# Patient Record
Sex: Female | Born: 1990 | Race: White | Hispanic: No | Marital: Single | Smoking: Former smoker
Health system: Southern US, Community
[De-identification: ages and names within clinical notes are randomized; demographics above are authoritative.]

## PROBLEM LIST (undated history)

## (undated) DIAGNOSIS — Z789 Other specified health status: Secondary | ICD-10-CM

## (undated) DIAGNOSIS — O26649 Intrahepatic cholestasis of pregnancy, unspecified trimester: Secondary | ICD-10-CM

## (undated) DIAGNOSIS — K831 Obstruction of bile duct: Secondary | ICD-10-CM

## (undated) DIAGNOSIS — O26619 Liver and biliary tract disorders in pregnancy, unspecified trimester: Secondary | ICD-10-CM

## (undated) HISTORY — DX: Obstruction of bile duct: K83.1

## (undated) HISTORY — DX: Intrahepatic cholestasis of pregnancy, unspecified trimester: O26.649

## (undated) HISTORY — DX: Obstruction of bile duct: O26.619

## (undated) HISTORY — PX: NO PAST SURGERIES: SHX2092

---

## 2012-04-18 DIAGNOSIS — O24419 Gestational diabetes mellitus in pregnancy, unspecified control: Secondary | ICD-10-CM

## 2017-11-23 ENCOUNTER — Encounter: Payer: Self-pay | Admitting: Certified Nurse Midwife

## 2017-11-23 ENCOUNTER — Other Ambulatory Visit (HOSPITAL_COMMUNITY)
Admission: RE | Admit: 2017-11-23 | Discharge: 2017-11-23 | Disposition: A | Discharge status: Home / Self Care | Payer: Medicaid Other | Source: Ambulatory Visit | Attending: Certified Nurse Midwife | Admitting: Certified Nurse Midwife

## 2017-11-23 ENCOUNTER — Ambulatory Visit (INDEPENDENT_AMBULATORY_CARE_PROVIDER_SITE_OTHER): Payer: Medicaid Other | Admitting: Certified Nurse Midwife

## 2017-11-23 VITALS — BP 134/79 | HR 115 | Ht 68.0 in | Wt 160.0 lb

## 2017-11-23 DIAGNOSIS — O468X9 Other antepartum hemorrhage, unspecified trimester: Secondary | ICD-10-CM

## 2017-11-23 DIAGNOSIS — O468X1 Other antepartum hemorrhage, first trimester: Secondary | ICD-10-CM

## 2017-11-23 DIAGNOSIS — Z3481 Encounter for supervision of other normal pregnancy, first trimester: Secondary | ICD-10-CM

## 2017-11-23 DIAGNOSIS — Z3A10 10 weeks gestation of pregnancy: Secondary | ICD-10-CM | POA: Insufficient documentation

## 2017-11-23 DIAGNOSIS — O99331 Smoking (tobacco) complicating pregnancy, first trimester: Secondary | ICD-10-CM

## 2017-11-23 DIAGNOSIS — O418X90 Other specified disorders of amniotic fluid and membranes, unspecified trimester, not applicable or unspecified: Secondary | ICD-10-CM | POA: Insufficient documentation

## 2017-11-23 DIAGNOSIS — Z8632 Personal history of gestational diabetes: Secondary | ICD-10-CM

## 2017-11-23 DIAGNOSIS — O99330 Smoking (tobacco) complicating pregnancy, unspecified trimester: Secondary | ICD-10-CM | POA: Insufficient documentation

## 2017-11-23 DIAGNOSIS — Z3480 Encounter for supervision of other normal pregnancy, unspecified trimester: Secondary | ICD-10-CM | POA: Insufficient documentation

## 2017-11-23 DIAGNOSIS — Z141 Cystic fibrosis carrier: Secondary | ICD-10-CM

## 2017-11-23 DIAGNOSIS — Z3687 Encounter for antenatal screening for uncertain dates: Secondary | ICD-10-CM | POA: Diagnosis not present

## 2017-11-23 DIAGNOSIS — O418X10 Other specified disorders of amniotic fluid and membranes, first trimester, not applicable or unspecified: Secondary | ICD-10-CM

## 2017-11-23 NOTE — Patient Instructions (Signed)
First Trimester of Pregnancy  Smoking During Pregnancy Smoking during pregnancy is unhealthy for you and your baby. Smoke from cigarettes, pipes, and cigars contains many chemicals that can cause cancer (carcinogens). Cigarettes also contain a stimulant drug (nicotine). When you smoke, harmful substances that you breathe in enter your bloodstream and can be passed on to your baby. This can affect your baby's development. If you are planning to become pregnant or have recently become pregnant, talk with your health care provider about quitting smoking. How does smoking affect me? Smoking increases your risk for many long-term (chronic) diseases. These diseases include cancer, lung diseases, and heart disease. Smoking during pregnancy increases your risk of:  Losing the pregnancy (miscarriage or stillbirth).  Giving birth too early (premature birth).  Pregnancy outside of the uterus (tubal pregnancy).  Having problems with the organ that provides the baby nourishment and oxygen (placenta), including: ? Attachment of the placenta over the opening of the uterus (placenta previa). ? Detachment of the placenta before the baby's birth (placental abruption).  Having your water break before labor begins (premature rupture of membranes).  How does smoking affect my baby? Before Birth Smoking during pregnancy:  Decreases blood flow and oxygen to your baby.  Increases your baby's risk of birth defects, such as heart defects.  Increases your baby's heart rate.  Slows your baby's growth in the uterus (intrauterine growth retardation).  After Birth Babies born to women who smoked during pregnancy may:  Have symptoms of nicotine withdrawal.  Need to stay in the hospital for special care.  May be too small at birth.  Have a high risk of: ? Serious health problems or lifelong disabilities. ? Sudden infant death syndrome (SIDS). ? Becoming obese. ? Developing behavior or learning  problems.  What can happen if changes are not made? When babies are born with a birth defect or illness, they often need to stay in the hospital longer before going home. Hospital stays may also be longer if you had any complications during labor or delivery. Longer hospital stays and more treatments result in higher costs for health care. Many health issues among babies born to mothers who smoke can have a lifelong impact. This may include the long-term need for certain medicines, therapies, or other treatments. What are the benefits of not smoking during pregnancy? You have a much better chance of having a healthy pregnancy and a healthy baby if you do not smoke while you are pregnant. Not smoking also means that you will have a better chance of living a long and healthy life, and your baby will have a better chance of growing into a healthy child and adult. What actions can be taken? Quitting smoking can be difficult. Ask your health care provider for help to stop smoking. You may also consider:  Counseling to help you quit smoking (smoking cessation counseling).  Psychotherapy.  Acupuncture.  Hypnosis.  Telephone [employer].  If these methods do not help you, talk with your health care provider about other options. Do not take smoking cessation medicines or nicotine supplements unless your health care provider tells you to. Where to find more information: Learn more about smoking during pregnancy and quitting smoking from:  March of Dimes: www.marchofdimes.org/pregnancy/smoking-during-pregnancy.aspx  U.S. Department of Health and Human Services: women.smokefree.gov  American Cancer Society: www.cancer.org  American Heart Association: www.heart.org  National Cancer Institute: www.cancer.gov  For help to quit smoking:  National smoking cessation telephone hotline: 1-800-QUIT NOW 973-349-7981)  Contact a health care provider  if:  You are struggling to quit smoking.  You are  a smoker and you become pregnant or plan to become pregnant.  You start smoking again after giving birth. Summary  Tobacco smoke contains harmful substances that can affect a baby's health and development.  Smoking increases the risk for serious problems, such as miscarriage, birth defects, or premature birth.  If you need help to quit smoking, ask your health care provider. This information is not intended to replace advice given to you by your health care provider. Make sure you discuss any questions you have with your health care provider. Document Released: 09/12/2004 Document Revised: 02/18/2016 Document Reviewed: 02/18/2016 Elsevier Interactive Patient Education  [employer]. The first trimester of pregnancy is from week 1 until the end of week 13 (months 1 through 3). During this time, your baby will begin to develop inside you. At 6-8 weeks, the eyes and face are formed, and the heartbeat can be seen on ultrasound. At the end of 12 weeks, all the baby's organs are formed. Prenatal care is all the medical care you receive before the birth of your baby. Make sure you get good prenatal care and follow all of your doctor's instructions. Follow these instructions at home: Medicines  Take over-the-counter and prescription medicines only as told by your doctor. Some medicines are safe and some medicines are not safe during pregnancy.  Take a prenatal vitamin that contains at least 600 micrograms (mcg) of folic acid.  If you have trouble pooping (constipation), take medicine that will make your stool soft (stool softener) if your doctor approves. Eating and drinking  Eat regular, healthy meals.  Your doctor will tell you the amount of weight gain that is right for you.  Avoid raw meat and uncooked cheese.  If you feel sick to your stomach (nauseous) or throw up (vomit): ? Eat 4 or 5 small meals a day instead of 3 large meals. ? Try eating a few soda crackers. ? Drink liquids  between meals instead of during meals.  To prevent constipation: ? Eat foods that are high in fiber, like fresh fruits and vegetables, whole grains, and beans. ? Drink enough fluids to keep your pee (urine) clear or pale yellow. Activity  Exercise only as told by your doctor. Stop exercising if you have cramps or pain in your lower belly (abdomen) or low back.  Do not exercise if it is too hot, too humid, or if you are in a place of great height (high altitude).  Try to avoid standing for long periods of time. Move your legs often if you must stand in one place for a long time.  Avoid heavy lifting.  Wear low-heeled shoes. Sit and stand up straight.  You can have sex unless your doctor tells you not to. Relieving pain and discomfort  Wear a good support bra if your breasts are sore.  Take warm water baths (sitz baths) to soothe pain or discomfort caused by hemorrhoids. Use hemorrhoid cream if your doctor says it is okay.  Rest with your legs raised if you have leg cramps or low back pain.  If you have puffy, bulging veins (varicose veins) in your legs: ? Wear support hose or compression stockings as told by your doctor. ? Raise (elevate) your feet for 15 minutes, 3-4 times a day. ? Limit salt in your food. Prenatal care  Schedule your prenatal visits by the twelfth week of pregnancy.  Write down your questions. Take them to  your prenatal visits.  Keep all your prenatal visits as told by your doctor. This is important. Safety  Wear your seat belt at all times when driving.  Make a list of emergency phone numbers. The list should include numbers for family, friends, the hospital, and police and fire departments. General instructions  Ask your doctor for a referral to a local prenatal class. Begin classes no later than at the start of month 6 of your pregnancy.  Ask for help if you need counseling or if you need help with nutrition. Your doctor can give you advice or tell  you where to go for help.  Do not use hot tubs, steam rooms, or saunas.  Do not douche or use tampons or scented sanitary pads.  Do not cross your legs for long periods of time.  Avoid all herbs and alcohol. Avoid drugs that are not approved by your doctor.  Do not use any tobacco products, including cigarettes, chewing tobacco, and electronic cigarettes. If you need help quitting, ask your doctor. You may get counseling or other support to help you quit.  Avoid cat litter boxes and soil used by cats. These carry germs that can cause birth defects in the baby and can cause a loss of your baby (miscarriage) or stillbirth.  Visit your dentist. At home, brush your teeth with a soft toothbrush. Be gentle when you floss. Contact a doctor if:  You are dizzy.  You have mild cramps or pressure in your lower belly.  You have a nagging pain in your belly area.  You continue to feel sick to your stomach, you throw up, or you have watery poop (diarrhea).  You have a bad smelling fluid coming from your vagina.  You have pain when you pee (urinate).  You have increased puffiness (swelling) in your face, hands, legs, or ankles. Get help right away if:  You have a fever.  You are leaking fluid from your vagina.  You have spotting or bleeding from your vagina.  You have very bad belly cramping or pain.  You gain or lose weight rapidly.  You throw up blood. It may look like coffee grounds.  You are around people who have [place] measles, fifth disease, or chickenpox.  You have a very bad headache.  You have shortness of breath.  You have any kind of trauma, such as from a fall or a car accident. Summary  The first trimester of pregnancy is from week 1 until the end of week 13 (months 1 through 3).  To take care of yourself and your unborn baby, you will need to eat healthy meals, take medicines only if your doctor tells you to do so, and do activities that are safe for you and  your baby.  Keep all follow-up visits as told by your doctor. This is important as your doctor will have to ensure that your baby is healthy and growing well. This information is not intended to replace advice given to you by your health care provider. Make sure you discuss any questions you have with your health care provider. Document Released: 10/18/2007 Document Revised: 05/09/2016 Document Reviewed: 05/09/2016 Elsevier Interactive Patient Education  2017 [employer]

## 2017-11-23 NOTE — Progress Notes (Signed)
Bedside u/S shows single IUP with FHT of 166 BPM and CRL is 33.364mm  [facility] 5519[facility]2d[facility]

## 2017-11-23 NOTE — Progress Notes (Signed)
Subjective:  Candace Ford is a 27 y.o. G4P2010 at 6943w2d being seen today for initial prenatal care.  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  Patient reports no complaints.   . Vag. Bleeding: None.   . Denies leaking of fluid.   The following portions of the patient's history were reviewed and updated as appropriate: allergies, current medications, past family history, past medical history, past social history, past surgical history and problem list. Problem list updated.  Objective:   Vitals:   11/23/17 1021 11/23/17 1022  BP: 134/79   Pulse: (!) 115   Weight: 160 lb (72.6 kg)   Height:  5\' 8"  (1.727 m)    Fetal Status: Fetal Heart Rate (bpm): 166         General:  Alert, oriented and cooperative. Patient is in no acute distress.  Skin: Skin is warm and dry. No rash noted.   Cardiovascular: Normal heart rate noted  Respiratory: Normal respiratory effort, no problems with respiration noted  Abdomen: Soft, gravid, appropriate for gestational age.       Pelvic: Vag. Bleeding: None Vag D/C Character: Thin   Cervical exam deferred        Extremities: Normal range of motion.     Mental Status: Normal mood and affect. Normal behavior. Normal judgment and thought content.  Breast: normal  Urinalysis: Urine Protein: Negative Urine Glucose: Negative  Assessment and Plan:  Pregnancy: G4P2010 at 5943w2d  1. Encounter for supervision of other normal pregnancy in first trimester - Obstetric panel - HIV antibody (with reflex) - Culture, OB Urine - US bedside; Future - US bedside; Future - HgB A1c - Cytology - PAP - NIPS  2. Maternal tobacco use in first trimester - 1/4 ppd -discussed cessation  3. Subchorionic hematoma in first trimester, single or  unspecified fetus - bleeding precautions  4. History of gestational diabetes - A1c today and early GDM screen  5. Cystic fibrosis gene carrier - genetic counseling  Preterm labor symptoms and general obstetric precautions including but not limited to vaginal bleeding, contractions, leaking of fluid and fetal movement were reviewed in detail with the patient. Please refer to After Visit Summary for other counseling recommendations.  Return in about 1 month (around 12/21/2017).   Donette LarryBhambri, Ryanne Morand, CNM

## 2017-11-25 LAB — URINE CULTURE, OB REFLEX

## 2017-11-25 LAB — CULTURE, OB URINE

## 2017-11-26 LAB — OBSTETRIC PANEL
ANTIBODY SCREEN: NOT DETECTED
Basophils Absolute: 18 cells/uL (ref 0–200)
Basophils Relative: 0.3 %
EOS PCT: 1.8 %
Eosinophils Absolute: 110 cells/uL (ref 15–500)
HCT: 37 % (ref 35.0–45.0)
HEMOGLOBIN: 12.4 g/dL (ref 11.7–15.5)
Hepatitis B Surface Ag: NONREACTIVE
Lymphs Abs: 1220 cells/uL (ref 850–3900)
MCH: 29.3 pg (ref 27.0–33.0)
MCHC: 33.5 g/dL (ref 32.0–36.0)
MCV: 87.5 fL (ref 80.0–100.0)
MPV: 9.9 fL (ref 7.5–12.5)
Monocytes Relative: 8 %
NEUTROS ABS: 4264 {cells}/uL (ref 1500–7800)
Neutrophils Relative %: 69.9 %
Platelets: 228 10*3/uL (ref 140–400)
RBC: 4.23 10*6/uL (ref 3.80–5.10)
RDW: 12.1 % (ref 11.0–15.0)
RPR Ser Ql: NONREACTIVE
Rubella: 1.81 index
Total Lymphocyte: 20 %
WBC: 6.1 10*3/uL (ref 3.8–10.8)
WBCMIX: 488 {cells}/uL (ref 200–950)

## 2017-11-26 LAB — HEMOGLOBIN A1C
EAG (MMOL/L): 5.4 (calc)
HEMOGLOBIN A1C: 5 %{Hb} (ref <5.7)
MEAN PLASMA GLUCOSE: 97 (calc)

## 2017-11-26 LAB — HIV ANTIBODY (ROUTINE TESTING W REFLEX): HIV 1&2 Ab, 4th Generation: NONREACTIVE

## 2017-11-27 LAB — CYTOLOGY - PAP
Chlamydia: NEGATIVE
Diagnosis: NEGATIVE
Neisseria Gonorrhea: NEGATIVE

## 2017-11-28 ENCOUNTER — Telehealth: Payer: Self-pay

## 2017-11-28 ENCOUNTER — Encounter (HOSPITAL_COMMUNITY): Payer: Self-pay

## 2017-11-28 NOTE — Telephone Encounter (Signed)
Spoke with pt and she is aware of normal pap smear results. Pap showed organisms consistent with a yeast infection. When I spoke with pt to see if she is symptomatic she states that she is not and treatment would not be necessary.

## 2017-11-30 ENCOUNTER — Ambulatory Visit (HOSPITAL_COMMUNITY)
Admission: RE | Admit: 2017-11-30 | Discharge: 2017-11-30 | Disposition: A | Discharge status: Home / Self Care | Payer: Medicaid Other | Source: Ambulatory Visit | Attending: Certified Nurse Midwife | Admitting: Certified Nurse Midwife

## 2017-12-24 ENCOUNTER — Ambulatory Visit (INDEPENDENT_AMBULATORY_CARE_PROVIDER_SITE_OTHER): Payer: Medicaid Other | Admitting: Certified Nurse Midwife

## 2017-12-24 VITALS — BP 116/72 | HR 95 | Wt 160.0 lb

## 2017-12-24 DIAGNOSIS — Z3482 Encounter for supervision of other normal pregnancy, second trimester: Secondary | ICD-10-CM

## 2017-12-24 DIAGNOSIS — Z8632 Personal history of gestational diabetes: Secondary | ICD-10-CM

## 2017-12-24 DIAGNOSIS — Z141 Cystic fibrosis carrier: Secondary | ICD-10-CM

## 2017-12-24 DIAGNOSIS — O99332 Smoking (tobacco) complicating pregnancy, second trimester: Secondary | ICD-10-CM

## 2017-12-24 DIAGNOSIS — Z3480 Encounter for supervision of other normal pregnancy, unspecified trimester: Secondary | ICD-10-CM

## 2017-12-24 NOTE — Progress Notes (Signed)
Subjective:  Candace Ford is a 27 y.o. [phone] at 10121w5d being seen today for ongoing prenatal care.  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  Patient reports no complaints.  Contractions: Not present. Vag. Bleeding: None.  Movement: Absent. Denies leaking of fluid.   The following portions of the patient's history were reviewed and updated as appropriate: allergies, current medications, past family history, past medical history, past social history, past surgical history and problem list. Problem list updated.  Objective:   Vitals:   12/24/17 1034  BP: 116/72  Pulse: 95  Weight: 72.6 kg    Fetal Status: Fetal Heart Rate (bpm): 148 Fundal Height: 14 cm Movement: Absent     General:  Alert, oriented and cooperative. Patient is in no acute distress.  Skin: Skin is warm and dry. No rash noted.   Cardiovascular: Normal heart rate noted  Respiratory: Normal respiratory effort, no problems with respiration noted  Abdomen: Soft, gravid, appropriate for gestational age. Pain/Pressure: Absent     Pelvic: Vag. Bleeding: None Vag D/C Character: Thin   Cervical exam deferred        Extremities: Normal range of motion.  Edema: None  Mental Status: Normal mood and affect. Normal behavior. Normal judgment and thought content.   Urinalysis:      Assessment and Plan:  Pregnancy: [id number] at 7021w5d  1. Encounter for supervision of other normal pregnancy in second trimester - US MFM OB COMP + 14 WK; Future  2. Maternal tobacco use in second trimester -vaping- 1 pod per week -discussed cessation, fetal affects are unknown but nicotine may cause adverse fetal outcomes  3. History of gestational diabetes - early GTT  4. Cystic fibrosis carrier - schedule genetic  counseling  Preterm labor symptoms and general obstetric precautions including but not limited to vaginal bleeding, contractions, leaking of fluid and fetal movement were reviewed in detail with the patient. Please refer to After Visit Summary for other counseling recommendations.  Return in about 4 weeks (around 01/21/2018).   Donette LarryBhambri, Aryianna Earwood, CNM

## 2017-12-24 NOTE — Patient Instructions (Signed)
Second Trimester of Pregnancy The second trimester is from week 13 through week 28, month 4 through 6. This is often the time in pregnancy that you feel your best. Often times, morning sickness has lessened or quit. You may have more energy, and you may get hungry more often. Your unborn baby (fetus) is growing rapidly. At the end of the sixth month, he or she is about 9 inches long and weighs about 1 pounds. You will likely feel the baby move (quickening) between 18 and 20 weeks of pregnancy. Follow these instructions at home:  Avoid all smoking, herbs, and alcohol. Avoid drugs not approved by your doctor.  Do not use any tobacco products, including cigarettes, chewing tobacco, and electronic cigarettes. If you need help quitting, ask your doctor. You may get counseling or other support to help you quit.  Only take medicine as told by your doctor. Some medicines are safe and some are not during pregnancy.  Exercise only as told by your doctor. Stop exercising if you start having cramps.  Eat regular, healthy meals.  Wear a good support bra if your breasts are tender.  Do not use hot tubs, steam rooms, or saunas.  Wear your seat belt when driving.  Avoid raw meat, uncooked cheese, and liter boxes and soil used by cats.  Take your prenatal vitamins.  Take 1500-2000 milligrams of calcium daily starting at the 20th week of pregnancy until you deliver your baby.  Try taking medicine that helps you poop (stool softener) as needed, and if your doctor approves. Eat more fiber by eating fresh fruit, vegetables, and whole grains. Drink enough fluids to keep your pee (urine) clear or pale yellow.  Take warm water baths (sitz baths) to soothe pain or discomfort caused by hemorrhoids. Use hemorrhoid cream if your doctor approves.  If you have puffy, bulging veins (varicose veins), wear support hose. Raise (elevate) your feet for 15 minutes, 3-4 times a day. Limit salt in your diet.  Avoid heavy  lifting, wear low heals, and sit up straight.  Rest with your legs raised if you have leg cramps or low back pain.  Visit your dentist if you have not gone during your pregnancy. Use a soft toothbrush to brush your teeth. Be gentle when you floss.  You can have sex (intercourse) unless your doctor tells you not to.  Go to your doctor visits. Get help if:  You feel dizzy.  You have mild cramps or pressure in your lower belly (abdomen).  You have a nagging pain in your belly area.  You continue to feel sick to your stomach (nauseous), throw up (vomit), or have watery poop (diarrhea).  You have bad smelling fluid coming from your vagina.  You have pain with peeing (urination). Get help right away if:  You have a fever.  You are leaking fluid from your vagina.  You have spotting or bleeding from your vagina.  You have severe belly cramping or pain.  You lose or gain weight rapidly.  You have trouble catching your breath and have chest pain.  You notice sudden or extreme puffiness (swelling) of your face, hands, ankles, feet, or legs.  You have not felt the baby move in over an hour.  You have severe headaches that do not go away with medicine.  You have vision changes. This information is not intended to replace advice given to you by your health care provider. Make sure you discuss any questions you have with your health care   provider. Document Released: 07/26/2009 Document Revised: 10/07/2015 Document Reviewed: 07/02/2012 Elsevier Interactive Patient Education  2017 [employer] Smoking During Pregnancy Smoking during pregnancy is unhealthy for you and your baby. Smoke from cigarettes, pipes, and cigars contains many chemicals that can cause cancer (carcinogens). Cigarettes also contain a stimulant drug (nicotine). When you smoke, harmful substances that you breathe in enter your bloodstream and can be passed on to your baby. This can affect your baby's development. If  you are planning to become pregnant or have recently become pregnant, talk with your health care provider about quitting smoking. How does smoking affect me? Smoking increases your risk for many long-term (chronic) diseases. These diseases include cancer, lung diseases, and heart disease. Smoking during pregnancy increases your risk of:  Losing the pregnancy (miscarriage or stillbirth).  Giving birth too early (premature birth).  Pregnancy outside of the uterus (tubal pregnancy).  Having problems with the organ that provides the baby nourishment and oxygen (placenta), including: ? Attachment of the placenta over the opening of the uterus (placenta previa). ? Detachment of the placenta before the baby's birth (placental abruption).  Having your water break before labor begins (premature rupture of membranes).  How does smoking affect my baby? Before Birth Smoking during pregnancy:  Decreases blood flow and oxygen to your baby.  Increases your baby's risk of birth defects, such as heart defects.  Increases your baby's heart rate.  Slows your baby's growth in the uterus (intrauterine growth retardation).  After Birth Babies born to women who smoked during pregnancy may:  Have symptoms of nicotine withdrawal.  Need to stay in the hospital for special care.  May be too small at birth.  Have a high risk of: ? Serious health problems or lifelong disabilities. ? Sudden infant death syndrome (SIDS). ? Becoming obese. ? Developing behavior or learning problems.  What can happen if changes are not made? When babies are born with a birth defect or illness, they often need to stay in the hospital longer before going home. Hospital stays may also be longer if you had any complications during labor or delivery. Longer hospital stays and more treatments result in higher costs for health care. Many health issues among babies born to mothers who smoke can have a lifelong impact. This may  include the long-term need for certain medicines, therapies, or other treatments. What are the benefits of not smoking during pregnancy? You have a much better chance of having a healthy pregnancy and a healthy baby if you do not smoke while you are pregnant. Not smoking also means that you will have a better chance of living a long and healthy life, and your baby will have a better chance of growing into a healthy child and adult. What actions can be taken? Quitting smoking can be difficult. Ask your health care provider for help to stop smoking. You may also consider:  Counseling to help you quit smoking (smoking cessation counseling).  Psychotherapy.  Acupuncture.  Hypnosis.  Telephone [employer] Products[employer]IT hotlines.  If these methods do not help you, talk with your health care provider about other options. Do not take smoking cessation medicines or nicotine supplements unless your health care provider tells you to. Where to find more information: Learn more about smoking during pregnancy and quitting smoking from:  March of Dimes: www.marchofdimes.org/pregnancy/smoking-during-pregnancy.aspx  U.S. Department of Health and Human Services: women.smokefree.gov  American Cancer Society: www.cancer.org  American Heart Association: www.heart.org  National Cancer Institute: www.cancer.gov  For help to quit smoking:  National smoking cessation telephone hotline: 1-800-QUIT NOW (518)196-2303(916-480-9990)  Contact a health care provider if:  You are struggling to quit smoking.  You are a smoker and you become pregnant or plan to become pregnant.  You start smoking again after giving birth. Summary  Tobacco smoke contains harmful substances that can affect a baby's health and development.  Smoking increases the risk for serious problems, such as miscarriage, birth defects, or premature birth.  If you need help to quit smoking, ask your health care provider. This information is not intended to replace advice  given to you by your health care provider. Make sure you discuss any questions you have with your health care provider. Document Released: 09/12/2004 Document Revised: 02/18/2016 Document Reviewed: 02/18/2016 Elsevier Interactive Patient Education  2018 [employer] What You Need to Know About Electronic Cigarettes Electronic cigarettes, or e-cigarettes, are battery-operated devices that deliver nicotine to your body. They come in many shapes, including in the shape of a cigarette, pipe, pen, and even a USB memory stick. E-cigarettes have a cartridge that contains a liquid form of nicotine. When you use the device, the liquid heats up. It then becomes a vapor. Inhaling this vapor is called vaping. While e-cigarettes do not contain tar and the same cancer-causing chemicals that are in tobacco cigarettes, they may contain other harmful and cancer-causing chemicals, such as formaldehyde or acetaldehyde. Nicotine is thought to increase your risk for certain types of cancer. Many e-cigarettes have chemical colorings and flavorings. It is not clear how much nicotine you get when vaping. The health effects of vaping are not completely known. Some people may use e-cigarettes in order to quit smoking tobacco. However, this has not been proven to work, and the Education officer, environmentalood and Drug Administration (FDA) has not approved e-cigarettes for this purpose. How can using electronic cigarettes affect me?  E-cigarettes contain nicotine, which is a very addictive drug. Vaping may make you crave nicotine. Nicotine: ? Changes your blood sugar levels. ? Increases your heart rate, blood pressure, and breathing rate. ? Increases your risk of developing blood clots (hypercoaguable state) and diabetes.  If you smoke e-cigarettes, you may be more likely to start smoking or to smoke more tobacco cigarettes.  Becoming addicted to nicotine may make your brain more sensitive to other addictive drugs. You may move to other addictive  substances.  If you are pregnant, the nicotine in e-cigarettes may be harmful to your baby. Nicotine can cause: ? Brain or lung problems for your baby. ? Your baby to be born too early. ? Your baby to be born with a low birth weight.  If you are a child or a teen, vaping may affect your memory or lower your attention span.  You may be in danger of overdosing on nicotine. Nicotine poisoning can cause nausea, vomiting, seizures, and trouble breathing. What are the benefits of stopping vaping? If you stop vaping, you can avoid:  Getting addicted to nicotine.  Having nicotine side effects.  Getting nicotine poisoning.  Being exposed to dangerous chemicals.  Increasing your risk of health problems.  Increasing your baby's risk of health problems, if you are pregnant.  Being more likely to use other addictive substances.  What steps can I take to stop vaping? If you can stop vaping on your own, do it before you become addicted to nicotine. If you need help stopping, ask your health care provider. There are three effective ways to fight nicotine addiction:  Nicotine replacement therapy. Using nicotine gum or  a nicotine patch blocks your craving for nicotine. Over time, you can reduce the amount of nicotine you use until you can stop using nicotine completely without having cravings.  Prescription medicines approved to fight nicotine addiction. These stop nicotine cravings or block the effects of nicotine.  Behavioral therapy. This may include: ? A self-help smoking cessation program. ? Individual or group therapy. ? A smoking cessation support group.  Where can I get support? You can get support at these sites:  U.S. [employer] of Medicine: WealthAccelerators.nl  U.S. Department of Health and Human Services: https://smokefree.gov  American Lung Association: ModelSolar.es  Where can I  get more information? Learn more about e-cigarettes from:  U.S. [employer] of Health: http://www.pena.net/  U.S. Department of Health and Human Services: PrankTips.hu  Summary  E-cigarettes can cause nicotine addiction.  E-cigarettes are not approved as a way to stop smoking.  E-cigarettes are not a risk-free alternative to smoking tobacco.  There are ways to fight nicotine addiction.  Talk to your health care provider if you are unable to stop vaping on your own. This information is not intended to replace advice given to you by your health care provider. Make sure you discuss any questions you have with your health care provider. Document Released: 08/23/2015 Document Revised: 01/19/2016 Document Reviewed: 04/23/2015 Elsevier Interactive Patient Education  [employer].

## 2018-01-02 ENCOUNTER — Telehealth: Payer: Self-pay | Admitting: *Deleted

## 2018-01-02 MED ORDER — TERCONAZOLE 0.8 % VA CREA: 1.0000 | TOPICAL_CREAM | Freq: Every day | VAGINAL | 0 refills | Status: DC

## 2018-01-02 NOTE — Telephone Encounter (Signed)
Pt called stating that she had yeast come back on her pap a couple of weeks ago and now she has the itching and d/c.  She is requesting something for the yeast.  [facility] is pregnant [facility].

## 2018-01-09 ENCOUNTER — Other Ambulatory Visit: Payer: Medicaid Other

## 2018-01-09 ENCOUNTER — Other Ambulatory Visit: Payer: Self-pay

## 2018-01-09 DIAGNOSIS — Z3480 Encounter for supervision of other normal pregnancy, unspecified trimester: Secondary | ICD-10-CM

## 2018-01-09 NOTE — Progress Notes (Signed)
MFM called needing order to be changed from MFM Complete to MFM Detail. Order changed.

## 2018-01-15 ENCOUNTER — Telehealth: Payer: Self-pay | Admitting: *Deleted

## 2018-01-15 ENCOUNTER — Other Ambulatory Visit: Payer: Medicaid Other

## 2018-01-15 NOTE — Telephone Encounter (Signed)
Left patient a voicemail to call and reschedule her GTT and Quad appointment missed on 01/15/18 at 8:45am. Because the importance of both test it is vital that these are done as soon as possible.

## 2018-01-16 ENCOUNTER — Encounter (HOSPITAL_COMMUNITY): Payer: Self-pay

## 2018-01-23 ENCOUNTER — Ambulatory Visit (HOSPITAL_COMMUNITY): Admission: RE | Admit: 2018-01-23 | Payer: Medicaid Other | Source: Ambulatory Visit

## 2018-01-23 ENCOUNTER — Encounter (HOSPITAL_COMMUNITY): Payer: Self-pay

## 2018-01-23 ENCOUNTER — Ambulatory Visit (HOSPITAL_COMMUNITY)
Admission: RE | Admit: 2018-01-23 | Discharge: 2018-01-23 | Disposition: A | Discharge status: Home / Self Care | Payer: Medicaid Other | Source: Ambulatory Visit | Attending: Certified Nurse Midwife | Admitting: Certified Nurse Midwife

## 2018-01-23 ENCOUNTER — Ambulatory Visit (HOSPITAL_COMMUNITY): Payer: Medicaid Other

## 2018-01-23 ENCOUNTER — Other Ambulatory Visit (HOSPITAL_COMMUNITY): Payer: Medicaid Other

## 2018-01-23 DIAGNOSIS — Z3480 Encounter for supervision of other normal pregnancy, unspecified trimester: Secondary | ICD-10-CM

## 2018-01-23 DIAGNOSIS — Z363 Encounter for antenatal screening for malformations: Secondary | ICD-10-CM | POA: Diagnosis not present

## 2018-01-23 DIAGNOSIS — O09293 Supervision of pregnancy with other poor reproductive or obstetric history, third trimester: Secondary | ICD-10-CM | POA: Diagnosis not present

## 2018-01-23 DIAGNOSIS — Z3482 Encounter for supervision of other normal pregnancy, second trimester: Secondary | ICD-10-CM | POA: Diagnosis not present

## 2018-01-23 DIAGNOSIS — Z3A19 19 weeks gestation of pregnancy: Secondary | ICD-10-CM | POA: Insufficient documentation

## 2018-01-23 DIAGNOSIS — O99332 Smoking (tobacco) complicating pregnancy, second trimester: Secondary | ICD-10-CM

## 2018-01-23 HISTORY — DX: Other specified health status: Z78.9

## 2018-01-24 ENCOUNTER — Encounter (HOSPITAL_COMMUNITY): Payer: Self-pay

## 2018-01-24 ENCOUNTER — Ambulatory Visit (INDEPENDENT_AMBULATORY_CARE_PROVIDER_SITE_OTHER): Payer: Medicaid Other | Admitting: Obstetrics & Gynecology

## 2018-01-24 VITALS — BP 112/74 | HR 99 | Wt 159.0 lb

## 2018-01-24 DIAGNOSIS — Z141 Cystic fibrosis carrier: Secondary | ICD-10-CM

## 2018-01-24 DIAGNOSIS — Z3480 Encounter for supervision of other normal pregnancy, unspecified trimester: Secondary | ICD-10-CM

## 2018-01-24 DIAGNOSIS — Z3482 Encounter for supervision of other normal pregnancy, second trimester: Secondary | ICD-10-CM

## 2018-01-24 NOTE — Progress Notes (Signed)
   PRENATAL VISIT NOTE  Subjective:  Candace Ford is a 27 y.o. [phone] at 4752w1d being seen today for ongoing prenatal care.  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  Patient reports no complaints.  Contractions: Not present. Vag. Bleeding: None.  Movement: Present. Denies leaking of fluid.   The following portions of the patient's history were reviewed and updated as appropriate: allergies, current medications, past family history, past medical history, past social history, past surgical history and problem list. Problem list updated.  Objective:   Vitals:   01/24/18 1317  BP: 112/74  Pulse: 99  Weight: 159 lb (72.1 kg)    Fetal Status: Fetal Heart Rate (bpm): 154   Movement: Present     General:  Alert, oriented and cooperative. Patient is in no acute distress.  Skin: Skin is warm and dry. No rash noted.   Cardiovascular: Normal heart rate noted  Respiratory: Normal respiratory effort, no problems with respiration noted  Abdomen: Soft, gravid, appropriate for gestational age.  Pain/Pressure: Absent     Pelvic: Cervical exam deferred        Extremities: Normal range of motion.  Edema: None  Mental Status: Normal mood and affect. Normal behavior. Normal judgment and thought content.   Assessment and Plan:  Pregnancy: [id number] at 3452w1d  1. Supervision of other normal pregnancy, antepartum -Quad screen today  Preterm labor symptoms and general obstetric precautions including but not limited to vaginal bleeding, contractions, leaking of fluid and fetal movement were reviewed in detail with the patient. Please refer to After Visit Summary for other counseling recommendations.  No follow-ups on file.  Future Appointments  Date Time Provider  Department Center  01/28/2018 11:00 AM [facility] GENETIC COUNSELING [facility]    Candace BossierMyra C Joseline Mccampbell, MD

## 2018-01-24 NOTE — Progress Notes (Signed)
   PRENATAL VISIT NOTE  Subjective:  Candace Ford is a 27 y.o. [phone] at 253w1d being seen today for ongoing prenatal care.  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  Patient reports no complaints.  Contractions: Not present. Vag. Bleeding: None.  Movement: Present. Denies leaking of fluid.   The following portions of the patient's history were reviewed and updated as appropriate: allergies, current medications, past family history, past medical history, past social history, past surgical history and problem list. Problem list updated.  Objective:   Vitals:   01/24/18 1317  BP: 112/74  Pulse: 99  Weight: 159 lb (72.1 kg)    Fetal Status: Fetal Heart Rate (bpm): 154   Movement: Present     General:  Alert, oriented and cooperative. Patient is in no acute distress.  Skin: Skin is warm and dry. No rash noted.   Cardiovascular: Normal heart rate noted  Respiratory: Normal respiratory effort, no problems with respiration noted  Abdomen: Soft, gravid, appropriate for gestational age.  Pain/Pressure: Absent     Pelvic: Cervical exam deferred        Extremities: Normal range of motion.  Edema: None  Mental Status: Normal mood and affect. Normal behavior. Normal judgment and thought content.   Assessment and Plan:  Pregnancy: [id number] at 553w1d  1. Supervision of other normal pregnancy, antepartum  - Alpha fetoprotein, maternal  Preterm labor symptoms and general obstetric precautions including but not limited to vaginal bleeding, contractions, leaking of fluid and fetal movement were reviewed in detail with the patient. Please refer to After Visit Summary for other counseling recommendations.  No follow-ups on file.  Future Appointments  Date Time  Provider Department Center  01/28/2018 11:00 AM [facility] GENETIC COUNSELING [facility]    Allie BossierMyra C Cortne Amara, MD

## 2018-01-24 NOTE — Addendum Note (Signed)
Addended by: Kathie DikeSOLA, DEANNA J on: 01/24/2018 01:35 PM   Modules accepted: Orders

## 2018-01-24 NOTE — Progress Notes (Signed)
Pt is coming Monday for AFP and early GTT

## 2018-01-25 LAB — AFP, QUAD SCREEN
AFP: 75.6 ng/mL
Age Alone: 1
Curr Gest Age: 19.1 weeks
Down Syndrome Scr Risk Est: <1
HCG TOTAL: 35.97 [IU]/mL
INH: 231 pg/mL
MOM FOR HCG: 1.76
MOM FOR INH: 1.35
Maternal Wt: 159 [lb_av]
MoM for AFP: 1.58
Osb Risk: 1
TWINS-AFP: 1
UE3 MOM: 1.34
UE3 VALUE: 2.03 ng/mL

## 2018-01-28 ENCOUNTER — Ambulatory Visit (HOSPITAL_COMMUNITY): Admission: RE | Admit: 2018-01-28 | Payer: Medicaid Other | Source: Ambulatory Visit

## 2018-02-21 ENCOUNTER — Encounter: Payer: Medicaid Other | Admitting: Obstetrics & Gynecology

## 2018-02-22 ENCOUNTER — Ambulatory Visit (INDEPENDENT_AMBULATORY_CARE_PROVIDER_SITE_OTHER): Payer: Medicaid Other | Admitting: Certified Nurse Midwife

## 2018-02-22 VITALS — BP 99/67 | HR 79 | Wt 164.0 lb

## 2018-02-22 DIAGNOSIS — Z141 Cystic fibrosis carrier: Secondary | ICD-10-CM

## 2018-02-22 DIAGNOSIS — O99332 Smoking (tobacco) complicating pregnancy, second trimester: Secondary | ICD-10-CM

## 2018-02-22 DIAGNOSIS — Z3482 Encounter for supervision of other normal pregnancy, second trimester: Secondary | ICD-10-CM

## 2018-02-22 DIAGNOSIS — Z3480 Encounter for supervision of other normal pregnancy, unspecified trimester: Secondary | ICD-10-CM

## 2018-02-22 NOTE — Progress Notes (Signed)
Subjective:  Candace Ford is a 27 y.o. [phone] at [redacted]w[redacted]d being seen today for ongoing prenatal care.  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  Contractions: Not present. Vag. Bleeding: None.  Movement: Present. Denies leaking of fluid.   The following portions of the patient's history were reviewed and updated as appropriate: allergies, current medications, past family history, past medical history, past social history, past surgical history and problem list. Problem list updated.  Objective:   Vitals:   02/22/18 0856  BP: 99/67  Pulse: 79  Weight: 74.4 kg    Fetal Status: Fetal Heart Rate (bpm): 143 Fundal Height: 23 cm Movement: Present     General:  Alert, oriented and cooperative. Patient is in no acute distress.  Skin: Skin is warm and dry. No rash noted.   Cardiovascular: Normal heart rate noted  Respiratory: Normal respiratory effort, no problems with respiration noted  Abdomen: Soft, gravid, appropriate for gestational age. Pain/Pressure: Absent     Pelvic: Vag. Bleeding: None Vag D/C Character: Thin   Cervical exam deferred        Extremities: Normal range of motion.  Edema: Trace  Mental Status: Normal mood and affect. Normal behavior. Normal judgment and thought content.   Urinalysis:      Assessment and Plan:  Pregnancy: [id number] at [redacted]w[redacted]d  1. Supervision of other normal pregnancy, antepartum - discussed maternity support belt  2. Maternal tobacco use in second trimester - uses vape, cut down to 1 pod q3 weeks - encouraged decrease and cessation  Preterm labor symptoms and general obstetric precautions including but not limited to vaginal  bleeding, contractions, leaking of fluid and fetal movement were reviewed in detail with the patient. Please refer to After Visit Summary for other counseling recommendations.  Return in about 5 weeks (around 03/29/2018).   Donette Larry, CNM

## 2018-02-22 NOTE — Patient Instructions (Signed)

## 2018-03-29 ENCOUNTER — Telehealth: Payer: Self-pay | Admitting: *Deleted

## 2018-03-29 NOTE — Telephone Encounter (Signed)
Pt. Needs to reschedule missed GTT/ROB appt on 03/29/18 at 8:45am. Message was left on a number that pt called from earlier in the wk to confirm appt. [phone]. I am not sure if the number belongs to her or not.

## 2018-04-08 ENCOUNTER — Ambulatory Visit (INDEPENDENT_AMBULATORY_CARE_PROVIDER_SITE_OTHER): Payer: Medicaid Other | Admitting: Family Medicine

## 2018-04-08 VITALS — BP 111/73 | HR 89 | Wt 170.0 lb

## 2018-04-08 DIAGNOSIS — Z23 Encounter for immunization: Secondary | ICD-10-CM

## 2018-04-08 DIAGNOSIS — Z3483 Encounter for supervision of other normal pregnancy, third trimester: Secondary | ICD-10-CM

## 2018-04-08 DIAGNOSIS — Z141 Cystic fibrosis carrier: Secondary | ICD-10-CM

## 2018-04-08 DIAGNOSIS — Z8632 Personal history of gestational diabetes: Secondary | ICD-10-CM

## 2018-04-08 DIAGNOSIS — Z3480 Encounter for supervision of other normal pregnancy, unspecified trimester: Secondary | ICD-10-CM

## 2018-04-08 NOTE — Progress Notes (Signed)
   PRENATAL VISIT NOTE  Subjective:  Candace Ford is a 27 y.o. [phone] at 853w5d being seen today for ongoing prenatal care.  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  Patient reports no complaints.  Contractions: Not present. Vag. Bleeding: None.  Movement: Present. Denies leaking of fluid.   The following portions of the patient's history were reviewed and updated as appropriate: allergies, current medications, past family history, past medical history, past social history, past surgical history and problem list. Problem list updated.  Objective:   Vitals:   04/08/18 1109  BP: 111/73  Pulse: 89  Weight: 170 lb (77.1 kg)    Fetal Status: Fetal Heart Rate (bpm): 153   Movement: Present     General:  Alert, oriented and cooperative. Patient is in no acute distress.  Skin: Skin is warm and dry. No rash noted.   Cardiovascular: Normal heart rate noted  Respiratory: Normal respiratory effort, no problems with respiration noted  Abdomen: Soft, gravid, appropriate for gestational age.  Pain/Pressure: Absent     Pelvic: Cervical exam deferred        Extremities: Normal range of motion.  Edema: Trace  Mental Status: Normal mood and affect. Normal behavior. Normal judgment and thought content.   Assessment and Plan:  Pregnancy: [id number] at 693w5d  1. Supervision of other normal pregnancy, antepartum FHT and FH normal - HIV antibody (with reflex) - CBC - RPR - 2Hr GTT w/ 1 Hr Carpenter 75 g - Tdap vaccine greater than or equal to 7yo IM  2. Cystic fibrosis gene carrier Discussed testing FOB. Discussed Genetics referral - needs to make appt.  3. History of gestational diabetes   Preterm labor symptoms and general obstetric precautions including but not  limited to vaginal bleeding, contractions, leaking of fluid and fetal movement were reviewed in detail with the patient. Please refer to After Visit Summary for other counseling recommendations.  Return in about 2 weeks (around 04/22/2018) for OB f/u.  No future appointments.  Levie HeritageJacob J , DO

## 2018-04-08 NOTE — Progress Notes (Signed)
Bedside U/S shows single IUP with CRL measuring 8.751mm and GA is 960w5d  FHT 129 BPM

## 2018-04-09 LAB — HIV ANTIBODY (ROUTINE TESTING W REFLEX): HIV 1&2 Ab, 4th Generation: NONREACTIVE

## 2018-04-09 LAB — CBC
HCT: 29.6 % — ABNORMAL LOW (ref 35.0–45.0)
Hemoglobin: 9.9 g/dL — ABNORMAL LOW (ref 11.7–15.5)
MCH: 28.2 pg (ref 27.0–33.0)
MCHC: 33.4 g/dL (ref 32.0–36.0)
MCV: 84.3 fL (ref 80.0–100.0)
MPV: 9.9 fL (ref 7.5–12.5)
Platelets: 188 10*3/uL (ref 140–400)
RBC: 3.51 10*6/uL — ABNORMAL LOW (ref 3.80–5.10)
RDW: 12.2 % (ref 11.0–15.0)
WBC: 8.3 10*3/uL (ref 3.8–10.8)

## 2018-04-09 LAB — 2HR GTT W 1 HR, CARPENTER, 75 G
GLUCOSE, 1 HR, GEST: 132 mg/dL (ref 65–179)
GLUCOSE, FASTING, GEST: 74 mg/dL (ref 65–91)
Glucose, 2 Hr, Gest: 125 mg/dL (ref 65–152)

## 2018-04-09 LAB — RPR: RPR: NONREACTIVE

## 2018-04-26 ENCOUNTER — Ambulatory Visit (INDEPENDENT_AMBULATORY_CARE_PROVIDER_SITE_OTHER): Payer: Medicaid Other

## 2018-04-26 VITALS — BP 99/70 | HR 91 | Temp 96.9°F | Wt 170.0 lb

## 2018-04-26 DIAGNOSIS — Z3480 Encounter for supervision of other normal pregnancy, unspecified trimester: Secondary | ICD-10-CM

## 2018-04-26 DIAGNOSIS — Z3483 Encounter for supervision of other normal pregnancy, third trimester: Secondary | ICD-10-CM

## 2018-04-26 MED ORDER — FERROUS SULFATE 325 (65 FE) MG PO TABS: 325.0000 mg | ORAL_TABLET | Freq: Every day | ORAL | 3 refills | Status: DC

## 2018-04-26 NOTE — Patient Instructions (Signed)

## 2018-04-26 NOTE — Progress Notes (Signed)
   PRENATAL VISIT NOTE  Subjective:  Candace Ford is a 27 y.o. [phone] at 3531w2d being seen today for ongoing prenatal care.  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  Patient reports no complaints.  Contractions: Not present. Vag. Bleeding: None.  Movement: Present. Denies leaking of fluid.   The following portions of the patient's history were reviewed and updated as appropriate: allergies, current medications, past family history, past medical history, past social history, past surgical history and problem list. Problem list updated.  Objective:   Vitals:   04/26/18 1036  BP: 99/70  Pulse: 91  Temp: (!) 96.9 F (36.1 C)  Weight: 170 lb (77.1 kg)    Fetal Status: Fetal Heart Rate (bpm): 150 Fundal Height: 32 cm Movement: Present     General:  Alert, oriented and cooperative. Patient is in no acute distress.  Skin: Skin is warm and dry. No rash noted.   Cardiovascular: Normal heart rate noted  Respiratory: Normal respiratory effort, no problems with respiration noted  Abdomen: Soft, gravid, appropriate for gestational age.  Pain/Pressure: Absent     Pelvic: Cervical exam deferred        Extremities: Normal range of motion.  Edema: Trace  Mental Status: Normal mood and affect. Normal behavior. Normal judgment and thought content.   Assessment and Plan:  Pregnancy: [id number] at 5531w2d  1. Supervision of other normal pregnancy, antepartum - No complaints, routine care - Labs reviewed. Hgb 9.9, iron supplement prescribed and constipation precautions reviewed  Preterm labor symptoms and general obstetric precautions including but not limited to vaginal bleeding, contractions, leaking of fluid and fetal movement were reviewed in detail with the  patient. Please refer to After Visit Summary for other counseling recommendations.  Return in about 2 weeks (around 05/10/2018) for Return OB visit.  Future Appointments  Date Time Provider Department Center  05/10/2018 10:45 AM Donette LarryBhambri, Melanie, CNM [facility]    Rolm BookbinderCaroline M Brandin Stetzer, [place] 04/26/18 10:50 AM

## 2018-04-27 ENCOUNTER — Emergency Department (INDEPENDENT_AMBULATORY_CARE_PROVIDER_SITE_OTHER)
Admission: EM | Admit: 2018-04-27 | Discharge: 2018-04-27 | Disposition: A | Discharge status: Home / Self Care | Payer: Medicaid Other | Source: Home / Self Care

## 2018-04-27 ENCOUNTER — Encounter: Payer: Self-pay | Admitting: Emergency Medicine

## 2018-04-27 DIAGNOSIS — J069 Acute upper respiratory infection, unspecified: Secondary | ICD-10-CM

## 2018-04-27 MED ORDER — AZITHROMYCIN 250 MG PO TABS: 250.0000 mg | ORAL_TABLET | Freq: Every day | ORAL | 0 refills | Status: DC

## 2018-04-27 NOTE — Discharge Instructions (Signed)
°  Please take antibiotics as prescribed and be sure to complete entire course even if you start to feel better to ensure infection does not come back.  Please follow up with family medicine in 1 week if not improving.  Call 911 or go to the hospital if symptoms worsening.

## 2018-04-27 NOTE — ED Provider Notes (Signed)
[facility]    CSN: [id number] Arrival date & time: 04/27/18  1318     History   Chief Complaint Chief Complaint  Patient presents with  . Cough    HPI Candace Ford is a 27 y.o. female.   HPI Candace Ford is a 27 y.o. female [phone] presenting to UC at [redacted]w[redacted]d with c/o 2 weeks of mildly productive cough.  She was seen by her OB/GYN yesterday for a routine prenatal care visit and everything with her pregnancy is going well.  Pt was advised to f/u in UC today for her cough.  Denies known fever. Denies n/v/d. No chest pain but mild SOB. Denies hx of heart problems. No hx of asthma. She has not taken any OTC medications.     Past Medical History:  Diagnosis Date  . Medical history non-contributory     Patient Active Problem List   Diagnosis Date Noted  . Supervision of other normal pregnancy, antepartum 11/23/2017  . 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 Subchorionic hematoma 11/23/2017  . History of gestational diabetes 11/23/2017  . 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 NO PAST SURGERIES      OB History    Gravida  4   Para  2   Term  2   Preterm      AB  1   Living  2     SAB  1   TAB      Ectopic      Multiple      Live Births               Home Medications    Prior to Admission medications   Medication Sig Start Date End Date Taking? Authorizing Provider  azithromycin (ZITHROMAX) 250 MG tablet Take 1 tablet (250 mg total) by mouth daily. Take first 2 tablets together, then 1 every day until finished. 04/27/18   Lurene Shadow, PA-C  ferrous sulfate 325 (65 FE) MG tablet Take 1 tablet (325 mg total) by mouth daily with breakfast. 04/26/18   Rolm Bookbinder, CNM  Prenatal Vit-Fe Fumarate-FA (PRENATAL VITAMIN) 27-0.8 MG TABS Prenatal Vitamin    [provider]    Family History Family History  Problem Relation Age of Onset  . Epilepsy Mother   . Heart  attack Father   . Diabetes Maternal Grandmother     Social History Social History   Tobacco Use  . Smoking status: Current Every Day Smoker    Packs/day: 0.25    Types: Cigarettes  . Smokeless tobacco: Never Used  Substance Use Topics  . Alcohol use: Never    Frequency: Never  . Drug use: Never     Allergies   Patient has no known allergies.   Review of Systems Review of Systems  Constitutional: Negative for chills and fever.  HENT: Positive for congestion and postnasal drip. Negative for sore throat.   Respiratory: Positive for cough and shortness of breath. Negative for chest tightness and wheezing.   Cardiovascular: Negative for chest pain, palpitations and leg swelling.  Gastrointestinal: Negative for diarrhea, nausea and vomiting.     Physical Exam Triage Vital Signs ED Triage Vitals  Enc Vitals Group     BP 04/27/18 1338 109/75     Pulse Rate 04/27/18 1338 (!) 103     Resp --      Temp 04/27/18 1338 (!) 97.5 F (36.4 C)  Temp Source 04/27/18 1338 Oral     SpO2 04/27/18 1338 99 %     Weight 04/27/18 1339 171 lb 12 oz (77.9 kg)     Height 04/27/18 1339 5\' 7"  (1.702 m)     Head Circumference --      Peak Flow --      Pain Score 04/27/18 1339 0     Pain Loc --      Pain Edu? --      Excl. in GC? --    No data found.  Updated Vital Signs BP 109/75 (BP Location: Right Arm)   Pulse (!) 103   Temp (!) 97.5 F (36.4 C) (Oral)   Ht 5\' 7"  (1.702 m)   Wt 171 lb 12 oz (77.9 kg)   LMP 09/12/2017   SpO2 99%   BMI 26.90 kg/m   Visual Acuity Right Eye Distance:   Left Eye Distance:   Bilateral Distance:    Right Eye Near:   Left Eye Near:    Bilateral Near:     Physical Exam Vitals signs and nursing note reviewed.  Constitutional:      Appearance: Normal appearance. She is well-developed.  HENT:     Head: Normocephalic and atraumatic.     Right Ear: Tympanic membrane normal.     Left Ear: Tympanic membrane normal.     Nose: Congestion  present.     Mouth/Throat:     Lips: Pink.     Mouth: Mucous membranes are moist.     Pharynx: Oropharynx is clear. Uvula midline. No posterior oropharyngeal erythema.  Neck:     Musculoskeletal: Normal range of motion and neck supple.  Cardiovascular:     Rate and Rhythm: Normal rate and regular rhythm.     Comments: Mild tachycardia in triage, regular rate and rhythm on exam Pulmonary:     Effort: Pulmonary effort is normal. No respiratory distress.     Breath sounds: Normal breath sounds. No stridor. No wheezing, rhonchi or rales.  Musculoskeletal: Normal range of motion.  Skin:    General: Skin is warm and dry.     Capillary Refill: Capillary refill takes less than 2 seconds.  Neurological:     Mental Status: She is alert and oriented to person, place, and time.  Psychiatric:        Behavior: Behavior normal.      UC Treatments / Results  Labs (all labs ordered are listed, but only abnormal results are displayed) Labs Reviewed - No data to display  EKG None  Radiology No results found.  Procedures Procedures (including critical care time)  Medications Ordered in UC Medications - No data to display  Initial Impression / Assessment and Plan / UC Course  I have reviewed the triage vital signs and the nursing notes.  Pertinent labs & imaging results that were available during my care of the patient were reviewed by me and considered in my medical decision making (see chart for details).    O2 Sat 99% on RA, lungs CTAB No respiratory distress on exam Cough for 2 weeks. Pt is [redacted]w[redacted]d gestation Will tx empirically for atypical bacteria  Encouraged f/u with PCP and GYN Discussed symptoms that warrant emergent care in the ED.  Final Clinical Impressions(s) / UC Diagnoses   Final diagnoses:  Upper respiratory tract infection, unspecified type     Discharge Instructions      Please take antibiotics as prescribed and be sure to complete entire course even if  you  start to feel better to ensure infection does not come back.  Please follow up with family medicine in 1 week if not improving.  Call 911 or go to the hospital if symptoms worsening.     ED Prescriptions    Medication Sig Dispense Auth. Provider   azithromycin (ZITHROMAX) 250 MG tablet Take 1 tablet (250 mg total) by mouth daily. Take first 2 tablets together, then 1 every day until finished. 6 tablet Lurene ShadowPhelps, Dewayne Severe O, PA-C     Controlled Substance Prescriptions [place] Controlled Substance Registry consulted? Not Applicable   Rolla Platehelps, Crecencio Kwiatek O, PA-C 04/27/18 1406

## 2018-04-27 NOTE — ED Triage Notes (Signed)
Patient c/o chest soreness, productive cough, SOB, sore throat, ears popping, runny nose, exposed to pneumonia, concerned for that.

## 2018-05-10 ENCOUNTER — Ambulatory Visit (INDEPENDENT_AMBULATORY_CARE_PROVIDER_SITE_OTHER): Payer: Medicaid Other | Admitting: Certified Nurse Midwife

## 2018-05-10 VITALS — BP 119/80 | HR 112 | Wt 175.0 lb

## 2018-05-10 DIAGNOSIS — O99333 Smoking (tobacco) complicating pregnancy, third trimester: Secondary | ICD-10-CM

## 2018-05-10 DIAGNOSIS — Z3483 Encounter for supervision of other normal pregnancy, third trimester: Secondary | ICD-10-CM

## 2018-05-10 DIAGNOSIS — L299 Pruritus, unspecified: Secondary | ICD-10-CM

## 2018-05-10 DIAGNOSIS — Z3480 Encounter for supervision of other normal pregnancy, unspecified trimester: Secondary | ICD-10-CM

## 2018-05-10 MED ORDER — HYDROXYZINE HCL 25 MG PO TABS: 25.0000 mg | ORAL_TABLET | Freq: Four times a day (QID) | ORAL | 0 refills | Status: DC | PRN

## 2018-05-10 NOTE — Progress Notes (Addendum)
Subjective:  Candace Ford is a 27 y.o. [phone] at 35w2dbeing seen today for ongoing prenatal care.  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  Patient reports itching, of soles, palms, and back of head x1 week. No new detergents or skin products. Contractions: Not present. Vag. Bleeding: None.  Movement: Present. Denies leaking of fluid.   The following portions of the patient's history were reviewed and updated as appropriate: allergies, current medications, past family history, past medical history, past social history, past surgical history and problem list. Problem list updated.  Objective:   Vitals:   05/10/18 1106  BP: 119/80  Pulse: (!) 112  Weight: 79.4 kg    Fetal Status: Fetal Heart Rate (bpm): 156 Fundal Height: 34 cm Movement: Present  Presentation: Vertex  General:  Alert, oriented and cooperative. Patient is in no acute distress.  Skin: Skin is warm and dry. No rash noted.   Cardiovascular: Normal heart rate noted  Respiratory: Normal respiratory effort, no problems with respiration noted  Abdomen: Soft, gravid, appropriate for gestational age. Pain/Pressure: Absent     Pelvic: Vag. Bleeding: None Vag D/C Character: Thin   Cervical exam deferred        Extremities: Normal range of motion.  Edema: None  Mental Status: Normal mood and affect. Normal behavior. Normal judgment and thought content.  Head: no abnormality, no parasites  Urinalysis:      Assessment and Plan:  Pregnancy: [id number] 338w2d1. Supervision of other normal pregnancy, antepartum  2. Itching - evaluate for ICP - Bile acids, total - Comp Met (CMET) - Rx Vistaril  3. Maternal tobacco use in third trimester - vaping, using 1 pod per 3 weeks - encouraged  cessation  Preterm labor symptoms and general obstetric precautions including but not limited to vaginal bleeding, contractions, leaking of fluid and fetal movement were reviewed in detail with the patient. Please refer to After Visit Summary for other counseling recommendations.  Return in about 2 weeks (around 05/24/2018).   BhJulianne HandlerCNM

## 2018-05-10 NOTE — Patient Instructions (Signed)
Braxton Hicks Contractions Contractions of the uterus can occur throughout pregnancy, but they are not always a sign that you are in labor. You may have practice contractions called Braxton Hicks contractions. These false labor contractions are sometimes confused with true labor. What are Braxton Hicks contractions? Braxton Hicks contractions are tightening movements that occur in the muscles of the uterus before labor. Unlike true labor contractions, these contractions do not result in opening (dilation) and thinning of the cervix. Toward the end of pregnancy (32-34 weeks), Braxton Hicks contractions can happen more often and may become stronger. These contractions are sometimes difficult to tell apart from true labor because they can be very uncomfortable. You should not feel embarrassed if you go to the hospital with false labor. Sometimes, the only way to tell if you are in true labor is for your health care provider to look for changes in the cervix. The health care provider will do a physical exam and may monitor your contractions. If you are not in true labor, the exam should show that your cervix is not dilating and your water has not broken. If there are no other health problems associated with your pregnancy, it is completely safe for you to be sent home with false labor. You may continue to have Braxton Hicks contractions until you go into true labor. How to tell the difference between true labor and false labor True labor  Contractions last 30-70 seconds.  Contractions become very regular.  Discomfort is usually felt in the top of the uterus, and it spreads to the lower abdomen and low back.  Contractions do not go away with walking.  Contractions usually become more intense and increase in frequency.  The cervix dilates and gets thinner. False labor  Contractions are usually shorter and not as strong as true labor contractions.  Contractions are usually irregular.  Contractions  are often felt in the front of the lower abdomen and in the groin.  Contractions may go away when you walk around or change positions while lying down.  Contractions get weaker and are shorter-lasting as time goes on.  The cervix usually does not dilate or become thin. Follow these instructions at home:   Take over-the-counter and prescription medicines only as told by your health care provider.  Keep up with your usual exercises and follow other instructions from your health care provider.  Eat and drink lightly if you think you are going into labor.  If Braxton Hicks contractions are making you uncomfortable: ? Change your position from lying down or resting to walking, or change from walking to resting. ? Sit and rest in a tub of warm water. ? Drink enough fluid to keep your urine pale yellow. Dehydration may cause these contractions. ? Do slow and deep breathing several times an hour.  Keep all follow-up prenatal visits as told by your health care provider. This is important. Contact a health care provider if:  You have a fever.  You have continuous pain in your abdomen. Get help right away if:  Your contractions become stronger, more regular, and closer together.  You have fluid leaking or gushing from your vagina.  You pass blood-tinged mucus (bloody show).  You have bleeding from your vagina.  You have low back pain that you never had before.  You feel your baby's head pushing down and causing pelvic pressure.  Your baby is not moving inside you as much as it used to. Summary  Contractions that occur before labor are   called Braxton Hicks contractions, false labor, or practice contractions.  Braxton Hicks contractions are usually shorter, weaker, farther apart, and less regular than true labor contractions. True labor contractions usually become progressively stronger and regular, and they become more frequent.  Manage discomfort from Braxton Hicks contractions  by changing position, resting in a warm bath, drinking plenty of water, or practicing deep breathing. This information is not intended to replace advice given to you by your health care provider. Make sure you discuss any questions you have with your health care provider. Document Released: 09/14/2016 Document Revised: 02/13/2017 Document Reviewed: 09/14/2016 Elsevier Interactive Patient Education  2019 [employer]  

## 2018-05-10 NOTE — Addendum Note (Signed)
Addended by: Donette LarryBHAMBRI, Burnett Spray E on: 05/10/2018 01:52 PM   Modules accepted: Orders

## 2018-05-12 LAB — COMPREHENSIVE METABOLIC PANEL
AG RATIO: 1.2 (calc) (ref 1.0–2.5)
ALKALINE PHOSPHATASE (APISO): 103 U/L (ref 33–115)
ALT: 6 U/L (ref 6–29)
AST: 9 U/L — AB (ref 10–30)
Albumin: 3.2 g/dL — ABNORMAL LOW (ref 3.6–5.1)
BILIRUBIN TOTAL: 0.3 mg/dL (ref 0.2–1.2)
BUN/Creatinine Ratio: 15 (calc) (ref 6–22)
BUN: 7 mg/dL (ref 7–25)
CALCIUM: 8.4 mg/dL — AB (ref 8.6–10.2)
CO2: 24 mmol/L (ref 20–32)
Chloride: 106 mmol/L (ref 98–110)
Creat: 0.48 mg/dL — ABNORMAL LOW (ref 0.50–1.10)
Globulin: 2.6 g/dL (calc) (ref 1.9–3.7)
Glucose, Bld: 98 mg/dL (ref 65–99)
Potassium: 4.4 mmol/L (ref 3.5–5.3)
Sodium: 137 mmol/L (ref 135–146)
Total Protein: 5.8 g/dL — ABNORMAL LOW (ref 6.1–8.1)

## 2018-05-12 LAB — BILE ACIDS, TOTAL: Bile Acids Total: 11 umol/L (ref 0–19)

## 2018-05-13 ENCOUNTER — Encounter: Payer: Self-pay | Admitting: Certified Nurse Midwife

## 2018-05-13 ENCOUNTER — Telehealth: Payer: Self-pay | Admitting: *Deleted

## 2018-05-13 DIAGNOSIS — O26649 Intrahepatic cholestasis of pregnancy, unspecified trimester: Secondary | ICD-10-CM | POA: Insufficient documentation

## 2018-05-13 DIAGNOSIS — O26619 Liver and biliary tract disorders in pregnancy, unspecified trimester: Secondary | ICD-10-CM

## 2018-05-13 DIAGNOSIS — K831 Obstruction of bile duct: Secondary | ICD-10-CM

## 2018-05-13 DIAGNOSIS — O26613 Liver and biliary tract disorders in pregnancy, third trimester: Principal | ICD-10-CM

## 2018-05-13 MED ORDER — URSODIOL 300 MG PO CAPS: 300.0000 mg | ORAL_CAPSULE | Freq: Two times a day (BID) | ORAL | 5 refills | Status: DC

## 2018-05-13 NOTE — Telephone Encounter (Signed)
Pt [facility] of lab results [facility] [place]  BPP and f/u growth scan scheduled for 05/20/17.  Pt is to have weekly BPP's

## 2018-05-13 NOTE — Telephone Encounter (Signed)
-----   Message from Donette LarryMelanie Bhambri, [place] sent at 05/13/2018 10:39 AM EST ----- Regarding: Lab results Labs indicate she has Cholestasis of pregnancy. She will need a growth US and BPP this week if possible and weekly BPPs. Will be delivered at 37 wks. Also needs Rx for Ursodiol 300 mg po bid. Please notify pt, if her itching doesn't improve we can adjust her meds. Thanks!

## 2018-05-15 NOTE — L&D Delivery Note (Signed)
Delivery Note At 1105 a viable female infant was delivered via SVD, presentation: ROA. APGAR: 9, 9; weight 6'13.   Placenta status: spontaneously delivered, intact via Tomasa Blase. Cord: 3 vessel. Cord ph n/a. Complications nuchal x1 loose, reduced.  Anesthesia: epidural Lacerations: none Est. Blood Loss (mL): 100  Mom to postpartum.  Baby to Couplet care / Skin to Skin.   Donette Larry, CNM 05/30/2018 11:20 AM

## 2018-05-20 ENCOUNTER — Ambulatory Visit (HOSPITAL_COMMUNITY)
Admission: RE | Admit: 2018-05-20 | Discharge: 2018-05-20 | Disposition: A | Discharge status: Home / Self Care | Payer: Medicaid Other | Source: Ambulatory Visit | Attending: Certified Nurse Midwife | Admitting: Certified Nurse Midwife

## 2018-05-20 ENCOUNTER — Encounter (HOSPITAL_COMMUNITY): Payer: Self-pay

## 2018-05-20 ENCOUNTER — Other Ambulatory Visit: Payer: Self-pay | Admitting: Certified Nurse Midwife

## 2018-05-20 ENCOUNTER — Other Ambulatory Visit (HOSPITAL_COMMUNITY): Payer: Self-pay | Admitting: *Deleted

## 2018-05-20 DIAGNOSIS — Z141 Cystic fibrosis carrier: Secondary | ICD-10-CM

## 2018-05-20 DIAGNOSIS — K831 Obstruction of bile duct: Secondary | ICD-10-CM | POA: Insufficient documentation

## 2018-05-20 DIAGNOSIS — Z3A35 35 weeks gestation of pregnancy: Secondary | ICD-10-CM

## 2018-05-20 DIAGNOSIS — O26613 Liver and biliary tract disorders in pregnancy, third trimester: Principal | ICD-10-CM

## 2018-05-20 DIAGNOSIS — O26643 Intrahepatic cholestasis of pregnancy, third trimester: Secondary | ICD-10-CM

## 2018-05-20 DIAGNOSIS — O99333 Smoking (tobacco) complicating pregnancy, third trimester: Secondary | ICD-10-CM

## 2018-05-20 DIAGNOSIS — O26619 Liver and biliary tract disorders in pregnancy, unspecified trimester: Secondary | ICD-10-CM | POA: Insufficient documentation

## 2018-05-20 DIAGNOSIS — O09293 Supervision of pregnancy with other poor reproductive or obstetric history, third trimester: Secondary | ICD-10-CM

## 2018-05-20 NOTE — Procedures (Signed)
Candace Ford 1990-12-06 [redacted]w[redacted]d  Fetus A Non-Stress Test Interpretation for 05/20/18  Indication: Unsatisfactory BPP  Fetal Heart Rate A Mode: External Baseline Rate (A): 145 bpm Variability: Moderate Accelerations: 15 x 15 Decelerations: None Multiple birth?: No  Uterine Activity Mode: Palpation, Toco Contraction Frequency (min): Rare Contraction Quality: Mild Resting Tone Palpated: Relaxed Resting Time: Adequate  Interpretation (Fetal Testing) Nonstress Test Interpretation: Reactive Overall Impression: Reassuring for gestational age Comments: EFM tracing reviewed by Dr. Judeth Cornfield

## 2018-05-24 ENCOUNTER — Ambulatory Visit (INDEPENDENT_AMBULATORY_CARE_PROVIDER_SITE_OTHER): Payer: Medicaid Other | Admitting: Certified Nurse Midwife

## 2018-05-24 ENCOUNTER — Telehealth (HOSPITAL_COMMUNITY): Payer: Self-pay | Admitting: *Deleted

## 2018-05-24 ENCOUNTER — Encounter (HOSPITAL_COMMUNITY): Payer: Self-pay | Admitting: *Deleted

## 2018-05-24 ENCOUNTER — Other Ambulatory Visit (HOSPITAL_COMMUNITY)
Admission: RE | Admit: 2018-05-24 | Discharge: 2018-05-24 | Disposition: A | Discharge status: Home / Self Care | Payer: Medicaid Other | Source: Ambulatory Visit | Attending: Certified Nurse Midwife | Admitting: Certified Nurse Midwife

## 2018-05-24 VITALS — BP 125/85 | HR 110 | Wt 177.0 lb

## 2018-05-24 DIAGNOSIS — Z3A36 36 weeks gestation of pregnancy: Secondary | ICD-10-CM

## 2018-05-24 DIAGNOSIS — K831 Obstruction of bile duct: Secondary | ICD-10-CM

## 2018-05-24 DIAGNOSIS — Z3480 Encounter for supervision of other normal pregnancy, unspecified trimester: Secondary | ICD-10-CM | POA: Insufficient documentation

## 2018-05-24 DIAGNOSIS — O26613 Liver and biliary tract disorders in pregnancy, third trimester: Secondary | ICD-10-CM

## 2018-05-24 DIAGNOSIS — O26643 Intrahepatic cholestasis of pregnancy, third trimester: Secondary | ICD-10-CM

## 2018-05-24 DIAGNOSIS — Z3483 Encounter for supervision of other normal pregnancy, third trimester: Secondary | ICD-10-CM

## 2018-05-24 MED ORDER — URSODIOL 300 MG PO CAPS: 300.0000 mg | ORAL_CAPSULE | Freq: Two times a day (BID) | ORAL | 0 refills | Status: DC

## 2018-05-24 NOTE — Progress Notes (Signed)
Subjective:  Candace Ford is a 28 y.o. [phone] at [redacted]w[redacted]d being seen today for ongoing prenatal care.  She is currently monitored for the following issues for this high-risk pregnancy and has Supervision of other normal pregnancy, antepartum; Tobacco use in pregnancy; Subchorionic hematoma; History of gestational diabetes; Cystic fibrosis gene carrier; and Cholestasis of pregnancy on their problem list.  Patient reports itching is improved with Vistaril.  Contractions: Not present. Vag. Bleeding: None.  Movement: Present. Denies leaking of fluid.   The following portions of the patient's history were reviewed and updated as appropriate: allergies, current medications, past family history, past medical history, past social history, past surgical history and problem list. Problem list updated.  Objective:   Vitals:   05/24/18 1015  BP: 125/85  Pulse: (!) 110  Weight: 80.3 kg    Fetal Status: Fetal Heart Rate (bpm): 139 Fundal Height: 36 cm Movement: Present  Presentation: Vertex  General:  Alert, oriented and cooperative. Patient is in no acute distress.  Skin: Skin is warm and dry. No rash noted.   Cardiovascular: Normal heart rate noted  Respiratory: Normal respiratory effort, no problems with respiration noted  Abdomen: Soft, gravid, appropriate for gestational age. Pain/Pressure: Absent     Pelvic: Vag. Bleeding: None Vag D/C Character: Thin   Cervical exam performed Dilation: Fingertip Effacement (%): 50 Station: -2  Extremities: Normal range of motion.  Edema: None  Mental Status: Normal mood and affect. Normal behavior. Normal judgment and thought content.   Urinalysis:      Assessment and Plan:  Pregnancy: [id number] at [redacted]w[redacted]d  1. Supervision of other normal pregnancy, antepartum - Culture, beta strep (group b only) - GC/Chlamydia probe amp ()not at [facility]  2. Cholestasis during pregnancy in third trimester - pharmacy did not have Actigall, Rx sent to different  pharmacy - Continue Vistaril prn - BPP in 3 days with MFM - IOL scheduled for 1/16 @0730  - Foley bulb in office day before  Preterm labor symptoms and general obstetric precautions including but not limited to vaginal bleeding, contractions, leaking of fluid and fetal movement were reviewed in detail with the patient. Please refer to After Visit Summary for other counseling recommendations.  Return in about 1 week (around 05/31/2018).   Donette Larry, CNM

## 2018-05-24 NOTE — Progress Notes (Signed)
Pt has not been taking Ursodiol. She states pharmacy didn't have it. I see Rx went through on 05/13/18. First BP 138/92. Repeat BP 125/85.

## 2018-05-24 NOTE — Telephone Encounter (Signed)
Preadmission screen  

## 2018-05-27 ENCOUNTER — Encounter (HOSPITAL_COMMUNITY): Payer: Self-pay

## 2018-05-27 ENCOUNTER — Ambulatory Visit (HOSPITAL_COMMUNITY)
Admission: RE | Admit: 2018-05-27 | Discharge: 2018-05-27 | Disposition: A | Discharge status: Home / Self Care | Payer: Medicaid Other | Source: Ambulatory Visit | Attending: Certified Nurse Midwife | Admitting: Certified Nurse Midwife

## 2018-05-27 DIAGNOSIS — Z3A36 36 weeks gestation of pregnancy: Secondary | ICD-10-CM

## 2018-05-27 DIAGNOSIS — K831 Obstruction of bile duct: Secondary | ICD-10-CM | POA: Diagnosis not present

## 2018-05-27 DIAGNOSIS — O99333 Smoking (tobacco) complicating pregnancy, third trimester: Secondary | ICD-10-CM | POA: Diagnosis not present

## 2018-05-27 DIAGNOSIS — O09893 Supervision of other high risk pregnancies, third trimester: Secondary | ICD-10-CM

## 2018-05-27 DIAGNOSIS — O26613 Liver and biliary tract disorders in pregnancy, third trimester: Secondary | ICD-10-CM | POA: Insufficient documentation

## 2018-05-27 DIAGNOSIS — O09293 Supervision of pregnancy with other poor reproductive or obstetric history, third trimester: Secondary | ICD-10-CM | POA: Diagnosis not present

## 2018-05-27 LAB — GC/CHLAMYDIA PROBE AMP (~~LOC~~) NOT AT ARMC
Chlamydia: NEGATIVE
Neisseria Gonorrhea: NEGATIVE

## 2018-05-27 LAB — CULTURE, BETA STREP (GROUP B ONLY)
MICRO NUMBER:: 39282
SPECIMEN QUALITY:: ADEQUATE

## 2018-05-28 ENCOUNTER — Telehealth: Payer: Self-pay

## 2018-05-28 NOTE — Telephone Encounter (Signed)
error 

## 2018-05-29 ENCOUNTER — Encounter: Payer: Self-pay | Admitting: Obstetrics and Gynecology

## 2018-05-29 ENCOUNTER — Ambulatory Visit (INDEPENDENT_AMBULATORY_CARE_PROVIDER_SITE_OTHER): Payer: Medicaid Other | Admitting: Obstetrics and Gynecology

## 2018-05-29 VITALS — BP 124/75 | HR 96 | Wt 178.0 lb

## 2018-05-29 DIAGNOSIS — K831 Obstruction of bile duct: Secondary | ICD-10-CM | POA: Diagnosis not present

## 2018-05-29 DIAGNOSIS — Z141 Cystic fibrosis carrier: Secondary | ICD-10-CM | POA: Diagnosis not present

## 2018-05-29 DIAGNOSIS — Z3A37 37 weeks gestation of pregnancy: Secondary | ICD-10-CM | POA: Diagnosis not present

## 2018-05-29 DIAGNOSIS — Z3493 Encounter for supervision of normal pregnancy, unspecified, third trimester: Secondary | ICD-10-CM

## 2018-05-29 DIAGNOSIS — O26613 Liver and biliary tract disorders in pregnancy, third trimester: Secondary | ICD-10-CM | POA: Diagnosis not present

## 2018-05-29 DIAGNOSIS — Z3490 Encounter for supervision of normal pregnancy, unspecified, unspecified trimester: Secondary | ICD-10-CM | POA: Insufficient documentation

## 2018-05-29 NOTE — Patient Instructions (Signed)

## 2018-05-29 NOTE — Progress Notes (Signed)
    PRENATAL VISIT NOTE  Subjective:  Candace Ford is a 28 y.o. [phone] at [redacted]w[redacted]d being seen today for ongoing prenatal care.  She is currently monitored for the following issues for this high-risk pregnancy and has Supervision of other normal pregnancy, antepartum; Tobacco use in pregnancy; Subchorionic hematoma; History of gestational diabetes; Cystic fibrosis gene carrier; Cholestasis of pregnancy; and Encounter for induction of labor on their problem list.  Patient reports no complaints.  Contractions: Not present. Vag. Bleeding: None.  Movement: Present. Denies leaking of fluid.   The following portions of the patient's history were reviewed and updated as appropriate: allergies, current medications, past family history, past medical history, past social history, past surgical history and problem list. Problem list updated.  Objective:   Vitals:   05/29/18 1511  BP: 124/75  Pulse: 96  Weight: 178 lb (80.7 kg)    Fetal Status:     Movement: Present     General:  Alert, oriented and cooperative. Patient is in no acute distress.  Skin: Skin is warm and dry. No rash noted.   Cardiovascular: Normal heart rate noted  Respiratory: Normal respiratory effort, no problems with respiration noted  Abdomen: Soft, gravid, appropriate for gestational age.  Pain/Pressure: Absent     Pelvic: Cervical exam deferred Dilation: Fingertip Effacement (%): 50 Station: -2  Extremities: Normal range of motion.  Edema: None  Mental Status:  Normal mood and affect. Normal behavior. Normal judgment and thought content.  Procedure: Patient informed of R/B/A of procedure. NST was performed and was reactive prior to procedure. NST:  EFM: Baseline: 130 bpm Toco: none Procedure done to begin ripening of the cervix prior to admission for induction of labor. Appropriate time out taken. The patient was placed in the lithotomy position and the cervix brought into view with sterile speculum. A ring forcep was used  to guide the 24F foley balloon through the internal os of the cervix. Foley Balloon filled with 50cc of normal saline. Plug inserted into end of the foley. Foley placed on tension and taped to medial thigh.  NST:  EFM Baseline: 135 bpm  Toco: none There were no signs of tachysystole or hypertonus. All equipment was removed and accounted for. The patient tolerated the procedure well.  Assessment and Plan:   Pregnancy: [id number] at [redacted]w[redacted]d  1. Encounter for induction of labor  - Foley bulb placed today in the office.   2. Cholestasis during pregnancy in third trimester  Induction scheduled for 0730 Am 05/30/2017  S/p Outpatient placement of foley balloon catheter for cervical ripening. Induction of labor scheduled for tomorrow at 0730 am. Reassuring FHR tracing with no concerns at present. Warning signs given to patient to include return to MAU for heavy vaginal bleeding, Rupture of membranes, painful uterine contractions q 5 mins or less, severe abdominal discomfort, decreased fetal movement.  No follow-ups on file.   Venia Carbon, NP 05/29/2018 4:57 PM

## 2018-05-30 ENCOUNTER — Inpatient Hospital Stay (HOSPITAL_COMMUNITY)
Admission: RE | Admit: 2018-05-30 | Discharge: 2018-05-31 | DRG: 805 | Disposition: A | Discharge status: Home / Self Care | Payer: Medicaid Other | Attending: Obstetrics & Gynecology | Admitting: Obstetrics & Gynecology

## 2018-05-30 ENCOUNTER — Encounter (HOSPITAL_COMMUNITY): Payer: Self-pay

## 2018-05-30 ENCOUNTER — Inpatient Hospital Stay (HOSPITAL_COMMUNITY): Payer: Medicaid Other | Admitting: Anesthesiology

## 2018-05-30 ENCOUNTER — Other Ambulatory Visit: Payer: Self-pay

## 2018-05-30 VITALS — BP 122/91 | HR 70 | Temp 97.9°F | Resp 16 | Ht 67.0 in | Wt 179.6 lb

## 2018-05-30 DIAGNOSIS — O2662 Liver and biliary tract disorders in childbirth: Secondary | ICD-10-CM | POA: Diagnosis present

## 2018-05-30 DIAGNOSIS — Z8632 Personal history of gestational diabetes: Secondary | ICD-10-CM | POA: Diagnosis present

## 2018-05-30 DIAGNOSIS — D649 Anemia, unspecified: Secondary | ICD-10-CM | POA: Diagnosis present

## 2018-05-30 DIAGNOSIS — K831 Obstruction of bile duct: Secondary | ICD-10-CM | POA: Diagnosis present

## 2018-05-30 DIAGNOSIS — O99330 Smoking (tobacco) complicating pregnancy, unspecified trimester: Secondary | ICD-10-CM | POA: Diagnosis present

## 2018-05-30 DIAGNOSIS — Z3A37 37 weeks gestation of pregnancy: Secondary | ICD-10-CM

## 2018-05-30 DIAGNOSIS — Z141 Cystic fibrosis carrier: Secondary | ICD-10-CM

## 2018-05-30 DIAGNOSIS — O9902 Anemia complicating childbirth: Secondary | ICD-10-CM | POA: Diagnosis present

## 2018-05-30 DIAGNOSIS — Z3480 Encounter for supervision of other normal pregnancy, unspecified trimester: Secondary | ICD-10-CM

## 2018-05-30 DIAGNOSIS — Z87891 Personal history of nicotine dependence: Secondary | ICD-10-CM

## 2018-05-30 DIAGNOSIS — O26619 Liver and biliary tract disorders in pregnancy, unspecified trimester: Secondary | ICD-10-CM

## 2018-05-30 LAB — TYPE AND SCREEN
ABO/RH(D): O POS
Antibody Screen: NEGATIVE

## 2018-05-30 LAB — CBC
HCT: 32.5 % — ABNORMAL LOW (ref 36.0–46.0)
Hemoglobin: 9.9 g/dL — ABNORMAL LOW (ref 12.0–15.0)
MCH: 25.7 pg — ABNORMAL LOW (ref 26.0–34.0)
MCHC: 30.5 g/dL (ref 30.0–36.0)
MCV: 84.4 fL (ref 80.0–100.0)
Platelets: 206 10*3/uL (ref 150–400)
RBC: 3.85 MIL/uL — ABNORMAL LOW (ref 3.87–5.11)
RDW: 15.3 % (ref 11.5–15.5)
WBC: 12.1 10*3/uL — ABNORMAL HIGH (ref 4.0–10.5)
nRBC: 0 % (ref 0.0–0.2)

## 2018-05-30 LAB — ABO/RH: ABO/RH(D): O POS

## 2018-05-30 MED ORDER — TERBUTALINE SULFATE 1 MG/ML IJ SOLN
0.2500 mg | Freq: Once | INTRAMUSCULAR | Status: DC | PRN
  Filled 2018-05-30: qty 1

## 2018-05-30 MED ORDER — COCONUT OIL OIL: 1.0000 "application " | TOPICAL_OIL | Status: DC | PRN

## 2018-05-30 MED ORDER — ACETAMINOPHEN 325 MG PO TABS: 650.0000 mg | ORAL_TABLET | ORAL | Status: DC | PRN

## 2018-05-30 MED ORDER — SENNOSIDES-DOCUSATE SODIUM 8.6-50 MG PO TABS
2.0000 | ORAL_TABLET | ORAL | Status: DC
  Administered 2018-05-30: 2 via ORAL
  Filled 2018-05-30: qty 2

## 2018-05-30 MED ORDER — FENTANYL 2.5 MCG/ML BUPIVACAINE 1/10 % EPIDURAL INFUSION (WH - ANES)
INTRAMUSCULAR | Status: AC
  Filled 2018-05-30: qty 100

## 2018-05-30 MED ORDER — ONDANSETRON HCL 4 MG/2ML IJ SOLN
4.0000 mg | Freq: Four times a day (QID) | INTRAMUSCULAR | Status: DC | PRN
  Filled 2018-05-30: qty 2

## 2018-05-30 MED ORDER — OXYTOCIN 40 UNITS IN NORMAL SALINE INFUSION - SIMPLE MED: 2.5000 [IU]/h | INTRAVENOUS | Status: DC

## 2018-05-30 MED ORDER — TETANUS-DIPHTH-ACELL PERTUSSIS 5-2.5-18.5 LF-MCG/0.5 IM SUSP: 0.5000 mL | Freq: Once | INTRAMUSCULAR | Status: DC

## 2018-05-30 MED ORDER — IBUPROFEN 600 MG PO TABS
600.0000 mg | ORAL_TABLET | Freq: Four times a day (QID) | ORAL | Status: DC
  Administered 2018-05-30 – 2018-05-31 (×4): 600 mg via ORAL
  Filled 2018-05-30 (×3): qty 1

## 2018-05-30 MED ORDER — SOD CITRATE-CITRIC ACID 500-334 MG/5ML PO SOLN: 30.0000 mL | ORAL | Status: DC | PRN

## 2018-05-30 MED ORDER — FENTANYL 2.5 MCG/ML BUPIVACAINE 1/10 % EPIDURAL INFUSION (WH - ANES)
14.0000 mL/h | INTRAMUSCULAR | Status: DC | PRN
  Administered 2018-05-30: 14 mL/h via EPIDURAL

## 2018-05-30 MED ORDER — DIPHENHYDRAMINE HCL 25 MG PO CAPS: 25.0000 mg | ORAL_CAPSULE | Freq: Four times a day (QID) | ORAL | Status: DC | PRN

## 2018-05-30 MED ORDER — PRENATAL MULTIVITAMIN CH
1.0000 | ORAL_TABLET | Freq: Every day | ORAL | Status: DC
  Administered 2018-05-30 – 2018-05-31 (×2): 1 via ORAL
  Filled 2018-05-30: qty 1

## 2018-05-30 MED ORDER — LACTATED RINGERS IV SOLN: 500.0000 mL | Freq: Once | INTRAVENOUS | Status: DC

## 2018-05-30 MED ORDER — EPHEDRINE 5 MG/ML INJ
10.0000 mg | INTRAVENOUS | Status: DC | PRN
  Filled 2018-05-30: qty 2

## 2018-05-30 MED ORDER — LIDOCAINE HCL (PF) 1 % IJ SOLN
INTRAMUSCULAR | Status: DC | PRN
  Administered 2018-05-30: 5 mL via EPIDURAL

## 2018-05-30 MED ORDER — LACTATED RINGERS IV SOLN
500.0000 mL | INTRAVENOUS | Status: DC | PRN
  Administered 2018-05-30: 500 mL via INTRAVENOUS

## 2018-05-30 MED ORDER — OXYTOCIN 40 UNITS IN NORMAL SALINE INFUSION - SIMPLE MED: 1.0000 m[IU]/min | INTRAVENOUS | Status: DC

## 2018-05-30 MED ORDER — OXYTOCIN BOLUS FROM INFUSION
500.0000 mL | Freq: Once | INTRAVENOUS | Status: AC
  Administered 2018-05-30: 500 mL via INTRAVENOUS

## 2018-05-30 MED ORDER — OXYTOCIN 40 UNITS IN NORMAL SALINE INFUSION - SIMPLE MED
1.0000 m[IU]/min | INTRAVENOUS | Status: DC
  Administered 2018-05-30: 2 m[IU]/min via INTRAVENOUS
  Filled 2018-05-30: qty 1000

## 2018-05-30 MED ORDER — PHENYLEPHRINE 40 MCG/ML (10ML) SYRINGE FOR IV PUSH (FOR BLOOD PRESSURE SUPPORT)
PREFILLED_SYRINGE | INTRAVENOUS | Status: AC
  Filled 2018-05-30: qty 20

## 2018-05-30 MED ORDER — LIDOCAINE HCL (PF) 1 % IJ SOLN
30.0000 mL | INTRAMUSCULAR | Status: DC | PRN
  Filled 2018-05-30: qty 30

## 2018-05-30 MED ORDER — EPHEDRINE 5 MG/ML INJ: 10.0000 mg | INTRAVENOUS | Status: DC | PRN

## 2018-05-30 MED ORDER — MISOPROSTOL 50MCG HALF TABLET: 50.0000 ug | ORAL_TABLET | ORAL | Status: DC | PRN

## 2018-05-30 MED ORDER — TERBUTALINE SULFATE 1 MG/ML IJ SOLN: 0.2500 mg | Freq: Once | INTRAMUSCULAR | Status: DC | PRN

## 2018-05-30 MED ORDER — OXYCODONE-ACETAMINOPHEN 5-325 MG PO TABS: 1.0000 | ORAL_TABLET | ORAL | Status: DC | PRN

## 2018-05-30 MED ORDER — PHENYLEPHRINE 40 MCG/ML (10ML) SYRINGE FOR IV PUSH (FOR BLOOD PRESSURE SUPPORT)
80.0000 ug | PREFILLED_SYRINGE | INTRAVENOUS | Status: DC | PRN
  Filled 2018-05-30: qty 10

## 2018-05-30 MED ORDER — WITCH HAZEL-GLYCERIN EX PADS: 1.0000 "application " | MEDICATED_PAD | CUTANEOUS | Status: DC | PRN

## 2018-05-30 MED ORDER — ONDANSETRON HCL 4 MG PO TABS: 4.0000 mg | ORAL_TABLET | ORAL | Status: DC | PRN

## 2018-05-30 MED ORDER — ONDANSETRON HCL 4 MG/2ML IJ SOLN: 4.0000 mg | INTRAMUSCULAR | Status: DC | PRN

## 2018-05-30 MED ORDER — SIMETHICONE 80 MG PO CHEW: 80.0000 mg | CHEWABLE_TABLET | ORAL | Status: DC | PRN

## 2018-05-30 MED ORDER — LACTATED RINGERS IV SOLN
INTRAVENOUS | Status: DC
  Administered 2018-05-30 (×2): via INTRAVENOUS

## 2018-05-30 MED ORDER — DIBUCAINE 1 % RE OINT: 1.0000 "application " | TOPICAL_OINTMENT | RECTAL | Status: DC | PRN

## 2018-05-30 MED ORDER — OXYCODONE-ACETAMINOPHEN 5-325 MG PO TABS: 2.0000 | ORAL_TABLET | ORAL | Status: DC | PRN

## 2018-05-30 MED ORDER — BENZOCAINE-MENTHOL 20-0.5 % EX AERO: 1.0000 "application " | INHALATION_SPRAY | CUTANEOUS | Status: DC | PRN

## 2018-05-30 MED ORDER — PHENYLEPHRINE 40 MCG/ML (10ML) SYRINGE FOR IV PUSH (FOR BLOOD PRESSURE SUPPORT): 80.0000 ug | PREFILLED_SYRINGE | INTRAVENOUS | Status: DC | PRN

## 2018-05-30 MED ORDER — DIPHENHYDRAMINE HCL 50 MG/ML IJ SOLN: 12.5000 mg | INTRAMUSCULAR | Status: DC | PRN

## 2018-05-30 NOTE — Anesthesia Pain Management Evaluation Note (Signed)
  CRNA Pain Management Visit Note  Patient: Candace Ford, 28 y.o., female  "Hello I am a member of the anesthesia team at [facility]. We have an anesthesia team available at all times to provide care throughout the hospital, including epidural management and anesthesia for C-section. I don't know your plan for the delivery whether it a natural birth, water birth, IV sedation, nitrous supplementation, doula or epidural, but we want to meet your pain goals."   1.Was your pain managed to your expectations on prior hospitalizations?   Yes   2.What is your expectation for pain management during this hospitalization?     Epidural  3.How can we help you reach that goal? Epidural in place  Record the patient's initial score and the patient's pain goal.   Pain: 4  Pain Goal: 4 The [facility] wants you to be able to say your pain was always managed very well.  Ramesha Poster 05/30/2018

## 2018-05-30 NOTE — Lactation Note (Signed)
This note was copied from a baby's chart. Lactation Consultation Note  Patient Name: Candace Ford [id number] Date: 05/30/2018 Reason for consult: Initial assessment;Early term 37-38.6wks  P3 mother whose infant is now 4 hours old.  Baby was wrapped in a blanket in mother's arms when I arrived.  Mother breast fed her first child (now 26 years old) for 3 months.  She did not breast feed her second child.  Mother had no questions/concerns related to breast feeding.  Encouraged her to feed 8-12 times/24 hours or sooner if baby shows feeding cues.  Reviewed feeding cues.  Mother is familiar with hand expression and has been able to hand express prior to delivery.  She will feed back any EBM she obtains to baby.  Colostrum container provided and milk storage times reviewed.   Maternal Data Formula Feeding for Exclusion: No Has patient been taught Hand Expression?: Yes Does the patient have breastfeeding experience prior to this delivery?: Yes  Feeding Feeding Type: Breast Fed  LATCH Score Latch: Repeated attempts needed to sustain latch, nipple held in mouth throughout feeding, stimulation needed to elicit sucking reflex.  Audible Swallowing: A few with stimulation  Type of Nipple: Everted at rest and after stimulation  Comfort (Breast/Nipple): Soft / non-tender  Hold (Positioning): No assistance needed to correctly position infant at breast.  LATCH Score: 8  Interventions Interventions: Breast feeding basics reviewed;Assisted with latch;Breast massage;Adjust position;Support pillows;Position options  Lactation Tools Discussed/Used     Consult Status Consult Status: Follow-up Date: 05/31/18 Follow-up type: In-patient    Dora Sims 05/30/2018, 7:49 PM

## 2018-05-30 NOTE — Anesthesia Preprocedure Evaluation (Signed)
Anesthesia Evaluation  Patient identified by MRN, date of birth, ID band Patient awake    Reviewed: Allergy & Precautions, H&P , NPO status , Patient's Chart, lab work & pertinent test results  Airway Mallampati: II  TM Distance: >3 FB Neck ROM: Full    Dental no notable dental hx. (+) Teeth Intact   Pulmonary neg pulmonary ROS, former smoker,    Pulmonary exam normal breath sounds clear to auscultation       Cardiovascular Exercise Tolerance: Good negative cardio ROS Normal cardiovascular exam Rhythm:Regular Rate:Normal     Neuro/Psych negative neurological ROS  negative psych ROS   GI/Hepatic   Endo/Other    Renal/GU      Musculoskeletal   Abdominal   Peds  Hematology  (+) Blood dyscrasia, anemia , Hgb 9.9   Anesthesia Other Findings   Reproductive/Obstetrics (+) Pregnancy                             Lab Results  Component Value Date   WBC 12.1 (H) 05/30/2018   HGB 9.9 (L) 05/30/2018   HCT 32.5 (L) 05/30/2018   MCV 84.4 05/30/2018   PLT 206 05/30/2018    Anesthesia Physical Anesthesia Plan  ASA: II  Anesthesia Plan: Epidural   Post-op Pain Management:    Induction:   PONV Risk Score and Plan:   Airway Management Planned:   Additional Equipment:   Intra-op Plan:   Post-operative Plan:   Informed Consent: I have reviewed the patients History and Physical, chart, labs and discussed the procedure including the risks, benefits and alternatives for the proposed anesthesia with the patient or authorized representative who has indicated his/her understanding and acceptance.       Plan Discussed with:   Anesthesia Plan Comments:         Anesthesia Quick Evaluation

## 2018-05-30 NOTE — Progress Notes (Signed)
Upon arrival, pt had a FB in place.  FB removed at 0600.

## 2018-05-30 NOTE — Progress Notes (Signed)
Candace Ford is a 28 y.o. [id number] at [redacted]w[redacted]d by LMP admitted for induction of labor due to cholestasis.  Subjective: Pt feeling vagina/rectal pressure, husband at bedside for support.   Objective: BP 126/84   Pulse 86   Temp 97.7 F (36.5 C) (Oral)   Resp 20   Ht 5\' 7"  (1.702 m)   Wt 81.5 kg   LMP 09/12/2017   SpO2 98%   Breastfeeding Unknown   BMI 28.13 kg/m  No intake/output data recorded. No intake/output data recorded.  FHT:  FHR: 120 bpm, variability: moderate,  accelerations:  Present,  decelerations:  Present isolated variables with contractions UC:   regular, every 2 minutes SVE:   Dilation: 8.5 Effacement (%): 90 Station: Plus 1 Exam by:: Henderson Newcomer RN  Labs: Lab Results  Component Value Date   WBC 12.1 (H) 05/30/2018   HGB 9.9 (L) 05/30/2018   HCT 32.5 (L) 05/30/2018   MCV 84.4 05/30/2018   PLT 206 05/30/2018    Assessment / Plan: Induction of labor due to cholestasis,  progressing well on pitocin  Labor: Progressing normally Preeclampsia:  n/a Fetal Wellbeing:  Category II Pain Control:  Epidural I/D:  GBS negative Anticipated MOD:  NSVD  Sharen Counter 05/30/2018, 10:56 AM

## 2018-05-30 NOTE — H&P (Signed)
OBSTETRIC ADMISSION HISTORY AND PHYSICAL  Katlyn Landuyt is a 28 y.o. female [phone] with IUP at [redacted]w[redacted]d by L/19 presenting for IOL cholestasis. Had outpatient FB placed and started to have contractions, so actually presented several hours prior to induction time.  Reports fetal movement. Denies vaginal bleeding, leakage of fluids.  She received her prenatal care at [facility].  Support person in labor: Husband Deeann Cree . 19w0: normal anatomy U/S, anterior placenta . 35w5: EFW 2683g (58%)  Prenatal History/Complications: . Cholestasis - on ursodiol, diagnosed at 34 weeks  . History of gestational diabetes . Cystic fibrosis gene carrier  . Subchorionic hematoma   Past Medical History: Past Medical History:  Diagnosis Date  . Cholestasis during pregnancy   . Medical history non-contributory     Past Surgical History: Past Surgical History:  Procedure Laterality Date  . NO PAST SURGERIES      Obstetrical History: OB History    Gravida  4   Para  2   Term  2   Preterm      AB  1   Living  2     SAB  1   TAB      Ectopic      Multiple      Live Births              Social History: Social History   Socioeconomic History  . Marital status: Single    Spouse name: Not on file  . Number of children: Not on file  . Years of education: Not on file  . Highest education level: Not on file  Occupational History  . Occupation: Museum/gallery conservator  . Financial resource strain: Not hard at all  . Food insecurity:    Worry: Never true    Inability: Never true  . Transportation needs:    Medical: No    Non-medical: Not on file  Tobacco Use  . Smoking status: Former Smoker    Packs/day: 0.25    Types: Cigarettes    Last attempt to quit: 05/24/2017    Years since quitting: 1.0  . Smokeless tobacco: Never Used  Substance and Sexual Activity  . Alcohol use: Never    Frequency: Never  . Drug use: Never  . Sexual activity: Yes     Partners: Male    Birth control/protection: None  Lifestyle  . Physical activity:    Days per week: Not on file    Minutes per session: Not on file  . Stress: To some extent  Relationships  . Social connections:    Talks on phone: Not on file    Gets together: Not on file    Attends religious service: Not on file    Active member of club or organization: Not on file    Attends meetings of clubs or organizations: Not on file    Relationship status: Not on file  Other Topics Concern  . Not on file  Social History Narrative  . Not on file    Family History: Family History  Problem Relation Age of Onset  . Epilepsy Mother   . Heart attack Father   . Diabetes Maternal Grandmother     Allergies: No Known Allergies  Medications Prior to Admission  Medication Sig Dispense Refill Last Dose  . azithromycin (ZITHROMAX) 250 MG tablet Take 1 tablet (250 mg total) by mouth daily. Take first 2 tablets together, then 1 every day until finished. (Patient not taking: Reported on 05/10/2018) 6  tablet 0 Not Taking  . ferrous sulfate 325 (65 FE) MG tablet Take 1 tablet (325 mg total) by mouth daily with breakfast. 30 tablet 3 Taking  . hydrOXYzine (ATARAX/VISTARIL) 25 MG tablet Take 1-2 tablets (25-50 mg total) by mouth every 6 (six) hours as needed for itching. 30 tablet 0 Taking  . Prenatal Vit-Fe Fumarate-FA (PRENATAL VITAMIN) 27-0.8 MG TABS Prenatal Vitamin   Taking  . ursodiol (ACTIGALL) 300 MG capsule Take 1 capsule (300 mg total) by mouth 2 (two) times daily. 60 capsule 5 Taking  . ursodiol (ACTIGALL) 300 MG capsule Take 1 capsule (300 mg total) by mouth 2 (two) times daily. 60 capsule 0 Taking     Review of Systems  All systems reviewed and negative except as stated in HPI  Blood pressure 122/88, pulse 86, temperature 98 F (36.7 C), temperature source Axillary, resp. rate 18, height 5\' 7"  (1.702 m), weight 81.5 kg, last menstrual period 09/12/2017, unknown if currently  breastfeeding. General appearance: uncomfortable appearing Lungs: no respiratory distress Heart: regular rate  Abdomen: soft, non-tender; gravid  Pelvic: deferred Extremities: no significant lower extremity edema Presentation: cephalic by prior checks Fetal monitoring: 130s/mod/+a/-d Uterine activity: every 3-4 minutes Dilation: 5 Effacement (%): 50 Station: -1 Exam by:: Mattelmber Pope RN  Prenatal labs: ABO, Rh: O/RH(D) POSITIVE/-- (07/12 1038) Antibody: NO ANTIBODIES DETECTED (07/12 1038) Rubella: 1.81 (07/12 1038) RPR: NON-REACTIVE (11/25 1054)  HBsAg: NON-REACTIVE (07/12 1038)  HIV: NON-REACTIVE (11/25 1054)  GBS:   negative Glucola: normal 2-hr Genetic screening:  Negative AFP screen   Prenatal Transfer Tool  Maternal Diabetes: No Genetic Screening: normal AFP, CF carrier Maternal Ultrasounds/Referrals: Normal Fetal Ultrasounds or other Referrals:  None Maternal Substance Abuse:  No Significant Maternal Medications: PNV, ursodiol Significant Maternal Lab Results: None  Results for orders placed or performed during the hospital encounter of 05/30/18 (from the past 24 hour(s))  CBC   Collection Time: 05/30/18  5:53 AM  Result Value Ref Range   WBC 12.1 (H) 4.0 - 10.5 K/uL   RBC 3.85 (L) 3.87 - 5.11 MIL/uL   Hemoglobin 9.9 (L) 12.0 - 15.0 g/dL   HCT 16.132.5 (L) 09.636.0 - 04.546.0 %   MCV 84.4 80.0 - 100.0 fL   MCH 25.7 (L) 26.0 - 34.0 pg   MCHC 30.5 30.0 - 36.0 g/dL   RDW 40.915.3 81.111.5 - 91.415.5 %   Platelets 206 150 - 400 K/uL   nRBC 0.0 0.0 - 0.2 %    Patient Active Problem List   Diagnosis Date Noted  . 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 Supervision of other normal pregnancy, antepartum 11/23/2017  . 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 Subchorionic hematoma 11/23/2017  . History of gestational diabetes 11/23/2017  . Cystic fibrosis gene carrier 11/23/2017    Assessment/Plan:  Madaline GuthrieGrace Jipson is a 28 y.o. [id number] at 8461w1d here for IOL for cholestasis after starting to  have regular contractions since placement of outpatient FB yesterday.  Labor: FB now out, only 50% effaced so will give one dose of cytotec.  -- pain control: desires epidural  Fetal Wellbeing: EFW 7-8lbs by Leopold's. Cephalic by prior checks.  -- GBS (negative) -- continuous fetal monitoring - category I   Postpartum Planning -- breast/?? (does not want BTL) -- RI/[x] Tdap   Laurel S. Earlene PlaterWallace, DO OB/GYN Fellow

## 2018-05-30 NOTE — Anesthesia Procedure Notes (Signed)
Epidural Patient location during procedure: OB Start time: 05/30/2018 6:36 AM End time: 05/30/2018 6:50 AM  Staffing Anesthesiologist: Trevor Iha, MD Performed: anesthesiologist   Preanesthetic Checklist Completed: patient identified, site marked, surgical consent, pre-op evaluation, timeout performed, IV checked, risks and benefits discussed and monitors and equipment checked  Epidural Patient position: sitting Prep: site prepped and draped and DuraPrep Patient monitoring: continuous pulse ox and blood pressure Approach: midline Location: L3-L4 Injection technique: LOR air  Needle:  Needle type: Tuohy  Needle gauge: 17 G Needle length: 9 cm and 9 Needle insertion depth: 6 cm Catheter type: closed end flexible Catheter size: 19 Gauge Catheter at skin depth: 11 cm Test dose: negative  Assessment Events: blood not aspirated, injection not painful, no injection resistance, negative IV test and no paresthesia  Additional Notes Patient identified. Risks/Benefits/Options discussed with patient including but not limited to bleeding, infection, nerve damage, paralysis, failed block, incomplete pain control, headache, blood pressure changes, nausea, vomiting, reactions to medication both or allergic, itching and postpartum back pain. Confirmed with bedside nurse the patient's most recent platelet count. Confirmed with patient that they are not currently taking any anticoagulation, have any bleeding history or any family history of bleeding disorders. Patient expressed understanding and wished to proceed. All questions were answered. Sterile technique was used throughout the entire procedure. Please see nursing notes for vital signs. Test dose was given through epidural needle and negative prior to continuing to dose epidural or start infusion. Warning signs of high block given to the patient including shortness of breath, tingling/numbness in hands, complete motor block, or any  concerning symptoms with instructions to call for help. Patient was given instructions on fall risk and not to get out of bed. All questions and concerns addressed with instructions to call with any issues.  Attempt (S) . Patient tolerated procedure well.

## 2018-05-30 NOTE — Anesthesia Postprocedure Evaluation (Signed)
Anesthesia Post Note  Patient: Candace Ford  Procedure(s) Performed: AN AD HOC LABOR EPIDURAL     Patient location during evaluation: Mother Baby Anesthesia Type: Epidural Level of consciousness: awake and alert and oriented Pain management: satisfactory to patient Vital Signs Assessment: post-procedure vital signs reviewed and stable Respiratory status: respiratory function stable Cardiovascular status: stable Postop Assessment: no headache, no backache, epidural receding, patient able to bend at knees, no signs of nausea or vomiting and adequate PO intake Anesthetic complications: no    Last Vitals:  Vitals:   05/30/18 1428 05/30/18 1843  BP: 127/85 (!) 137/92  Pulse: 74 68  Resp: 18 17  Temp: 36.7 C 36.6 C  SpO2: 99%     Last Pain:  Vitals:   05/30/18 1848  TempSrc:   PainSc: 3    Pain Goal:                @[email or web address] ([id number])  )Clark Clowdus

## 2018-05-31 ENCOUNTER — Telehealth: Payer: Self-pay | Admitting: *Deleted

## 2018-05-31 LAB — RPR: RPR Ser Ql: NONREACTIVE

## 2018-05-31 MED ORDER — IBUPROFEN 600 MG PO TABS: 600.0000 mg | ORAL_TABLET | Freq: Four times a day (QID) | ORAL | 0 refills | Status: DC | PRN

## 2018-05-31 NOTE — Telephone Encounter (Signed)
Left patient a message to call and schedule 6 week Postpartum and IUD Insertion as soon as possible.

## 2018-05-31 NOTE — Lactation Note (Signed)
This note was copied from a baby's chart. Lactation Consultation Note  Patient Name: [name] [id number] Date: 05/31/2018 Reason for consult: Follow-up assessment;Infant weight loss Baby is 26 hours old and at a 10% weight loss.  Baby has been latching but feeds are 5 minutes or less.  Mom is pumping with symphony pump but not obtaining colostrum.  She does obtain colostrum with hand expression.  Parents have started supplementing with formula due to weight loss.  Instructed to offer breast with any feeding cue but at least every 3 hours, Post pump x 15 minutes and supplement with 15-30 mls of formula every 3 hours.  Encouraged to call for latch assist prn.  Maternal Data    Feeding    LATCH Score                   Interventions    Lactation Tools Discussed/Used     Consult Status Consult Status: Follow-up Date: 06/01/18 Follow-up type: In-patient    [name] 05/31/2018, 1:20 PM

## 2018-05-31 NOTE — Discharge Summary (Signed)
OB Discharge Summary     Patient Name: Candace Ford DOB: Sep 03, 1990 MRN: [id number]  Date of admission: 05/30/2018 Delivering MD: Donette Larry   Date of discharge: 05/31/2018  Admitting diagnosis: INDUCTION Intrauterine pregnancy: Unknown     Secondary diagnosis:  Principal Problem:   Cholestasis of pregnancy Active Problems:   Tobacco use in pregnancy   History of gestational diabetes  Additional problems: anemia of pregnancy     Discharge diagnosis: Term Pregnancy Delivered and Cholestasis                                                                                                Post partum procedures:none  Augmentation: Pitocin and Foley Balloon  Complications: None  Hospital course:  Induction of Labor With Vaginal Delivery   28 y.o. yo [phone] at Unknown was admitted to the hospital 05/30/2018 for induction of labor.  Indication for induction: Cholestasis of pregnancy. She had a cervical foley placed in the outpt setting which came out and triggered labor ctx. She went on to have an uncomplicated labor course as follows: Membrane Rupture Time/Date: 9:52 AM ,05/30/2018   Intrapartum Procedures: Episiotomy: None [1]                                         Lacerations:  None [1]  Patient had delivery of a Viable infant.  Information for the patient's newborn:  [place], Darner [[id number]]      05/30/2018  Details of delivery can be found in separate delivery note.  Patient had a routine postpartum course. Patient is discharged home 05/31/18 per her request for early discharge.  Physical exam  Vitals:   05/30/18 1428 05/30/18 1843 05/30/18 2205 05/31/18 0530  BP: 127/85 (!) 137/92 117/83 99/81  Pulse: 74 68 75 86  Resp: 18 17 20 18   Temp: 98.1 F (36.7 C) 97.8 F (36.6 C) 98.3 F (36.8 C) 97.9 F (36.6 C)  TempSrc: Oral  Oral Oral  SpO2: 99%   99%  Weight:      Height:       General: alert and cooperative Lochia: appropriate Uterine Fundus:  firm Incision: N/A DVT Evaluation: No evidence of DVT seen on physical exam. Labs: Lab Results  Component Value Date   WBC 12.1 (H) 05/30/2018   HGB 9.9 (L) 05/30/2018   HCT 32.5 (L) 05/30/2018   MCV 84.4 05/30/2018   PLT 206 05/30/2018   CMP Latest Ref Rng & Units 05/10/2018  Glucose 65 - 99 mg/dL 98  BUN 7 - 25 mg/dL 7  Creatinine 9.89 - 2.11 mg/dL 9.41(D)  Sodium 408 - 144 mmol/L 137  Potassium 3.5 - 5.3 mmol/L 4.4  Chloride 98 - 110 mmol/L 106  CO2 20 - 32 mmol/L 24  Calcium 8.6 - 10.2 mg/dL 8.1(E)  Total Protein 6.1 - 8.1 g/dL 5.6(D)  Total Bilirubin 0.2 - 1.2 mg/dL 0.3  AST 10 - 30 U/L 9(L)  ALT 6 - 29 U/L 6    Discharge instruction:  per After Visit Summary and "Baby and Me Booklet".  After visit meds:  Allergies as of 05/31/2018   No Known Allergies     Medication List    STOP taking these medications   azithromycin 250 MG tablet Commonly known as:  ZITHROMAX   hydrOXYzine 25 MG tablet Commonly known as:  ATARAX/VISTARIL   ursodiol 300 MG capsule Commonly known as:  ACTIGALL     TAKE these medications   ferrous sulfate 325 (65 FE) MG tablet Take 1 tablet (325 mg total) by mouth daily with breakfast.   ibuprofen 600 MG tablet Commonly known as:  ADVIL,MOTRIN Take 1 tablet (600 mg total) by mouth every 6 (six) hours as needed.       Diet: routine diet  Activity: Advance as tolerated. Pelvic rest for 6 weeks.   Outpatient follow up:4 weeks Follow up Appt:No future appointments. Follow up Visit:No follow-ups on file.  Postpartum contraception: IUD Mirena  Newborn Data: Live born female  Birth Weight: 6 lb 13.5 oz (3105 g) APGAR: 9, 9  Newborn Delivery   Birth date/time:  05/30/2018 11:05:00 Delivery type:  Vaginal, Spontaneous     Baby Feeding: Breast Disposition:home with mother   05/31/2018 Arabella MerlesKimberly D Nakaya Mishkin, CNM  1:01 PM

## 2018-05-31 NOTE — Telephone Encounter (Signed)
-----   Message from Donette Larry, [place] sent at 05/30/2018 11:22 AM EST ----- Regarding: pp message Please schedule this patient for Postpartum visit in: 6 weeks with the following provider: Any provider For C/S patients schedule nurse incision check in weeks 2 weeks: no Low risk pregnancy complicated by:  Delivery mode:  SVD Anticipated Birth Control:  IUD PP Procedures needed:   Schedule Integrated BH visit: no

## 2018-05-31 NOTE — Discharge Instructions (Signed)
Vaginal Delivery, Care After °Refer to this sheet in the next few weeks. These instructions provide you with information about caring for yourself after vaginal delivery. Your health care provider may also give you more specific instructions. Your treatment has been planned according to current medical practices, but problems sometimes occur. Call your health care provider if you have any problems or questions. °What can I expect after the procedure? °After vaginal delivery, it is common to have: °· Some bleeding from your vagina. °· Soreness in your abdomen, your vagina, and the area of skin between your vaginal opening and your anus (perineum). °· Pelvic cramps. °· Fatigue. °Follow these instructions at home: °Medicines °· Take over-the-counter and prescription medicines only as told by your health care provider. °· If you were prescribed an antibiotic medicine, take it as told by your health care provider. Do not stop taking the antibiotic until it is finished. °Driving ° °· Do not drive or operate heavy machinery while taking prescription pain medicine. °· Do not drive for 24 hours if you received a sedative. °Lifestyle °· Do not drink alcohol. This is especially important if you are breastfeeding or taking medicine to relieve pain. °· Do not use tobacco products, including cigarettes, chewing tobacco, or e-cigarettes. If you need help quitting, ask your health care provider. °Eating and drinking °· Drink at least 8 eight-ounce glasses of water every day unless you are told not to by your health care provider. If you choose to breastfeed your baby, you may need to drink more water than this. °· Eat high-fiber foods every day. These foods may help prevent or relieve constipation. High-fiber foods include: °? Whole grain cereals and breads. °? Brown rice. °? Beans. °? Fresh fruits and vegetables. °Activity °· Return to your normal activities as told by your health care provider. Ask your health care provider what  activities are safe for you. °· Rest as much as possible. Try to rest or take a nap when your baby is sleeping. °· Do not lift anything that is heavier than your baby or 10 lb (4.5 kg) until your health care provider says that it is safe. °· Talk with your health care provider about when you can engage in sexual activity. This may depend on your: °? Risk of infection. °? Rate of healing. °? Comfort and desire to engage in sexual activity. °Vaginal Care °· If you have an episiotomy or a vaginal tear, check the area every day for signs of infection. Check for: °? More redness, swelling, or pain. °? More fluid or blood. °? Warmth. °? Pus or a bad smell. °· Do not use tampons or douches until your health care provider says this is safe. °· Watch for any blood clots that may pass from your vagina. These may look like clumps of dark red, brown, or black discharge. °General instructions °· Keep your perineum clean and dry as told by your health care provider. °· Wear loose, comfortable clothing. °· Wipe from front to back when you use the toilet. °· Ask your health care provider if you can shower or take a bath. If you had an episiotomy or a perineal tear during labor and delivery, your health care provider may tell you not to take baths for a certain length of time. °· Wear a bra that supports your breasts and fits you well. °· If possible, have someone help you with household activities and help care for your baby for at least a few days after you   leave the hospital. °· Keep all follow-up visits for you and your baby as told by your health care provider. This is important. °Contact a health care provider if: °· You have: °? Vaginal discharge that has a bad smell. °? Difficulty urinating. °? Pain when urinating. °? A sudden increase or decrease in the frequency of your bowel movements. °? More redness, swelling, or pain around your episiotomy or vaginal tear. °? More fluid or blood coming from your episiotomy or vaginal  tear. °? Pus or a bad smell coming from your episiotomy or vaginal tear. °? A fever. °? A rash. °? Little or no interest in activities you used to enjoy. °? Questions about caring for yourself or your baby. °· Your episiotomy or vaginal tear feels warm to the touch. °· Your episiotomy or vaginal tear is separating or does not appear to be healing. °· Your breasts are painful, hard, or turn red. °· You feel unusually sad or worried. °· You feel nauseous or you vomit. °· You pass large blood clots from your vagina. If you pass a blood clot from your vagina, save it to show to your health care provider. Do not flush blood clots down the toilet without having your health care provider look at them. °· You urinate more than usual. °· You are dizzy or light-headed. °· You have not breastfed at all and you have not had a menstrual period for 12 weeks after delivery. °· You have stopped breastfeeding and you have not had a menstrual period for 12 weeks after you stopped breastfeeding. °Get help right away if: °· You have: °? Pain that does not go away or does not get better with medicine. °? Chest pain. °? Difficulty breathing. °? Blurred vision or spots in your vision. °? Thoughts about hurting yourself or your baby. °· You develop pain in your abdomen or in one of your legs. °· You develop a severe headache. °· You faint. °· You bleed from your vagina so much that you fill two sanitary pads in one hour. °This information is not intended to replace advice given to you by your health care provider. Make sure you discuss any questions you have with your health care provider. °Document Released: 04/28/2000 Document Revised: 10/13/2015 Document Reviewed: 05/16/2015 °Elsevier Interactive Patient Education © 2019 [employer] ° °

## 2018-05-31 NOTE — Lactation Note (Signed)
This note was copied from a baby's chart. Lactation Consultation Note  Patient Name: Candace Ford [id number] Date: 05/31/2018 Reason for consult: Follow-up assessment;Early term 10-38.6wks Mom chose to give formula this morning because baby was fussy after breastfeeding.  Stressed importance of putting baby to breast first with cues.  Discussed milk coming to volume.  Encouraged to call for assist/concerns prn.  Maternal Data    Feeding    LATCH Score                   Interventions    Lactation Tools Discussed/Used     Consult Status Consult Status: Follow-up Date: 06/01/18 Follow-up type: In-patient    [name] 05/31/2018, 1:29 PM

## 2018-07-19 ENCOUNTER — Ambulatory Visit: Payer: Medicaid Other | Admitting: Certified Nurse Midwife

## 2018-07-19 ENCOUNTER — Telehealth: Payer: Self-pay | Admitting: *Deleted

## 2018-07-19 NOTE — Telephone Encounter (Signed)
Left patient an urgent message to call and reschedule missed Postpartum appointment on 07/19/18 at 9:00am.

## 2019-12-26 ENCOUNTER — Ambulatory Visit: Payer: Medicaid Other | Admitting: Certified Nurse Midwife

## 2020-02-17 ENCOUNTER — Emergency Department (INDEPENDENT_AMBULATORY_CARE_PROVIDER_SITE_OTHER)
Admission: RE | Admit: 2020-02-17 | Discharge: 2020-02-17 | Disposition: A | Discharge status: Home / Self Care | Payer: Medicaid Other | Source: Ambulatory Visit

## 2020-02-17 ENCOUNTER — Other Ambulatory Visit: Payer: Self-pay

## 2020-02-17 VITALS — BP 104/72 | HR 82 | Temp 97.9°F | Ht 67.0 in | Wt 172.0 lb

## 2020-02-17 DIAGNOSIS — R300 Dysuria: Secondary | ICD-10-CM | POA: Diagnosis not present

## 2020-02-17 DIAGNOSIS — N3001 Acute cystitis with hematuria: Secondary | ICD-10-CM

## 2020-02-17 LAB — POCT URINALYSIS DIP (MANUAL ENTRY)
Bilirubin, UA: NEGATIVE
Glucose, UA: NEGATIVE mg/dL
Ketones, POC UA: NEGATIVE mg/dL
Nitrite, UA: POSITIVE — AB
Protein Ur, POC: 100 mg/dL — AB
Spec Grav, UA: 1.025 (ref 1.010–1.025)
Urobilinogen, UA: 1 E.U./dL
pH, UA: 6 (ref 5.0–8.0)

## 2020-02-17 MED ORDER — CEFDINIR 300 MG PO CAPS: 600.0000 mg | ORAL_CAPSULE | Freq: Every day | ORAL | 0 refills | Status: DC

## 2020-02-17 NOTE — ED Triage Notes (Signed)
Dysuria x 5 days LBP

## 2020-02-17 NOTE — ED Provider Notes (Addendum)
[facility]    CSN: [id number] Arrival date & time: 02/17/20  1447      History   Chief Complaint Chief Complaint  Patient presents with   Dysuria    HPI Candace Ford is a 29 y.o. female.   HPI  Presents for evaluation of urinary frequency, urgency and dysuria x 5 days, without flank pain, with lower back pain. No fever, chills, or abnormal vaginal discharge or bleeding. Patient's last menstrual period was 02/11/2020 (exact date).  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 Authorizing Provider  acetaminophen (TYLENOL) 325 MG tablet Take 650 mg by mouth every 6 (six) hours as needed.   Yes [provider]  phenazopyridine (PYRIDIUM) 95 MG tablet Take 95 mg by mouth 3 (three) times daily as needed for pain.   Yes [provider]  ferrous sulfate 325 (65 FE) MG tablet Take 1 tablet (325 mg total) by mouth daily with breakfast. 04/26/18   Rolm Bookbinder, CNM  ibuprofen (ADVIL,MOTRIN) 600 MG tablet Take 1 tablet (600 mg total) by mouth every 6 (six) hours as needed. 05/31/18   Arabella Merles, CNM    Family History Family History  Problem Relation Age of Onset   Epilepsy Mother    Heart attack  Father    Diabetes Maternal Grandmother     Social History Social History   Tobacco Use   Smoking status: Former Smoker    Packs/day: 0.25    Types: Cigarettes   Smokeless tobacco: Never Used  Building services engineer Use: Every day   Substances: Nicotine, Flavoring  Substance Use Topics   Alcohol use: Yes   Drug use: Never     Allergies   Patient has no known allergies. Review of Systems Review of Systems Pertinent negatives listed in HPI Physical Exam Triage Vital Signs ED Triage Vitals  Enc Vitals Group     BP 02/17/20 1459 104/72     Pulse Rate 02/17/20 1459 82     Resp --      Temp 02/17/20 1459 97.9 F (36.6 C)     Temp Source 02/17/20 1459 Oral     SpO2 02/17/20 1459 99 %     Weight 02/17/20 1500 172 lb (78 kg)     Height 02/17/20 1500 5\' 7"  (1.702 m)     Head Circumference --      Peak Flow --      Pain Score 02/17/20 1500  7     Pain Loc --      Pain Edu? --      Excl. in GC? --    No data found.  Updated Vital Signs BP 104/72 (BP Location: Right Arm)    Pulse 82    Temp 97.9 F (36.6 C) (Oral)    Ht 5\' 7"  (1.702 m)    Wt 172 lb (78 kg)    LMP 02/11/2020 (Exact Date)    SpO2 99%    BMI 26.94 kg/m   Visual Acuity Right Eye Distance:   Left Eye Distance:   Bilateral Distance:    Right Eye Near:   Left Eye Near:    Bilateral Near:     Physical Exam   UC Treatments / Results  Labs (all labs ordered are listed, but only abnormal results are displayed) Labs Reviewed  POCT URINALYSIS DIP (MANUAL ENTRY) - Abnormal; Notable for the following components:      Result Value   Color, UA orange (*)    Clarity, UA cloudy (*)    Blood, UA moderate (*)    Protein Ur, POC =100 (*)    Nitrite, UA Positive (*)    Leukocytes, UA Small (1+) (*)    All other components within normal limits    EKG   Radiology No results found.  Procedures Procedures (including critical care time)  Medications Ordered in UC Medications - No data to  display  Initial Impression / Assessment and Plan / UC Course  I have reviewed the triage vital signs and the nursing notes.  Pertinent labs & imaging results that were available during my care of the patient were reviewed by me and considered in my medical decision making (see chart for details).    UA likely contaminated due to AZO, however given symptoms positive nitrates, will treat empirically with Omnicef  x daily 10 days. Urine culture pending. Encourage hydrating with water. Red flags discussed. AVS given. Final Clinical Impressions(s) / UC Diagnoses   Final diagnoses:  Dysuria  Acute cystitis with hematuria   Discharge Instructions   None    ED Prescriptions    Medication Sig Dispense Auth. Provider   cefdinir (OMNICEF) 300 MG capsule Take 2 capsules (600 mg total) by mouth daily. 20 capsule 02/13/2020, FNP     PDMP not reviewed this encounter.   Bing Neighbors, FNP 02/19/20 2358    04/20/20, FNP 02/19/20 [phone]

## 2020-02-19 ENCOUNTER — Other Ambulatory Visit: Payer: Self-pay | Admitting: Family Medicine

## 2020-02-19 LAB — URINE CULTURE
MICRO NUMBER:: 11035558
SPECIMEN QUALITY:: ADEQUATE

## 2020-04-29 ENCOUNTER — Other Ambulatory Visit: Payer: Self-pay

## 2020-04-29 ENCOUNTER — Emergency Department (INDEPENDENT_AMBULATORY_CARE_PROVIDER_SITE_OTHER): Payer: Medicaid Other

## 2020-04-29 ENCOUNTER — Emergency Department
Admission: RE | Admit: 2020-04-29 | Discharge: 2020-04-29 | Disposition: A | Discharge status: Home / Self Care | Payer: Medicaid Other | Source: Ambulatory Visit

## 2020-04-29 VITALS — BP 111/77 | HR 91 | Temp 99.4°F | Resp 15 | Ht 67.0 in | Wt 162.0 lb

## 2020-04-29 DIAGNOSIS — R110 Nausea: Secondary | ICD-10-CM

## 2020-04-29 DIAGNOSIS — R1011 Right upper quadrant pain: Secondary | ICD-10-CM

## 2020-04-29 DIAGNOSIS — R1013 Epigastric pain: Secondary | ICD-10-CM

## 2020-04-29 DIAGNOSIS — K30 Functional dyspepsia: Secondary | ICD-10-CM

## 2020-04-29 LAB — POCT URINALYSIS DIP (MANUAL ENTRY)
Bilirubin, UA: NEGATIVE
Blood, UA: NEGATIVE
Glucose, UA: NEGATIVE mg/dL
Leukocytes, UA: NEGATIVE
Nitrite, UA: NEGATIVE
Protein Ur, POC: NEGATIVE mg/dL
Spec Grav, UA: 1.015 (ref 1.010–1.025)
Urobilinogen, UA: 2 E.U./dL — AB
pH, UA: 6.5 (ref 5.0–8.0)

## 2020-04-29 LAB — POCT CBC W AUTO DIFF (K'VILLE URGENT CARE)

## 2020-04-29 LAB — POCT URINE PREGNANCY: Preg Test, Ur: NEGATIVE

## 2020-04-29 MED ORDER — OMEPRAZOLE 20 MG PO CPDR: 20.0000 mg | DELAYED_RELEASE_CAPSULE | Freq: Every day | ORAL | 0 refills | Status: DC

## 2020-04-29 NOTE — Discharge Instructions (Addendum)
  Call to schedule a follow up appointment with Primary care as well as a GI (stomach) specialist for further evaluation and treatment of symptoms.   Call 911 or have someone drive you to the hospital if symptoms significantly worsening.

## 2020-04-29 NOTE — ED Triage Notes (Signed)
Nausea w/ indigestion  since Sunday  Also noted yellow soft stools Urine is concentrated per pt  Also c/o indigestion  Denies pregnancy, fever or chills NO COVID or Flu vaccine

## 2020-04-29 NOTE — ED Provider Notes (Addendum)
[facility]    CSN: [id number] Arrival date & time: 04/29/20  1202      History   Chief Complaint Chief Complaint  Patient presents with  . Gastroesophageal Reflux  . Nausea    HPI Candace Ford is a 29 y.o. female.   HPI Candace Ford is a 29 y.o. female presenting to UC with c/o intermittent nausea with indigestion that started 5 days ago.  She has also noticed soft yellow stools and more concentrated urine.  She had nausea and vomiting for one day this past weekend but states her children had a stomach bug and were over their symptoms quickly. Pt concerned her symptoms are due to her gallbladder. Denies fever or chills. Eating makes symptoms worsening. Hx of cholestasis in pregnancy.    Past Medical History:  Diagnosis Date  . Cholestasis during pregnancy   . Medical history non-contributory     Patient Active Problem List   Diagnosis Date Noted  . 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 Supervision of other normal pregnancy, antepartum 11/23/2017  . 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 Subchorionic hematoma 11/23/2017  . History of gestational diabetes 11/23/2017  . 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 NO PAST SURGERIES      OB History    Gravida  4   Para  3   Term  3   Preterm      AB  1   Living  3     SAB  1   IAB      Ectopic      Multiple      Live Births  1            Home Medications    Prior to Admission medications   Medication Sig Start Date End Date Taking? Authorizing Provider  acetaminophen (TYLENOL) 325 MG tablet Take 650 mg by mouth every 6 (six) hours as needed. Patient not taking: Reported on 04/29/2020    [provider]  cefdinir (OMNICEF) 300 MG capsule Take 2 capsules (600 mg total) by mouth daily. Patient not taking: Reported on 04/29/2020 02/17/20   Bing Neighbors, FNP  ferrous sulfate 325 (65 FE) MG tablet Take 1 tablet (325  mg total) by mouth daily with breakfast. Patient not taking: Reported on 04/29/2020 04/26/18   Rolm Bookbinder, CNM  ibuprofen (ADVIL,MOTRIN) 600 MG tablet Take 1 tablet (600 mg total) by mouth every 6 (six) hours as needed. Patient not taking: Reported on 04/29/2020 05/31/18   Arabella Merles, CNM  omeprazole (PRILOSEC) 20 MG capsule Take 1 capsule (20 mg total) by mouth daily. 04/29/20   Lurene Shadow, PA-C  phenazopyridine (PYRIDIUM) 95 MG tablet Take 95 mg by mouth 3 (three) times daily as needed for pain. Patient not taking: Reported on 04/29/2020    [provider]    Family History Family History  Problem Relation Age of Onset  . Epilepsy Mother   . Heart attack Father   . Diabetes Maternal Grandmother   . Healthy Sister   . Healthy Brother     Social History Social History   Tobacco Use  . Smoking status: Former Smoker    Packs/day: 0.25    Types: Cigarettes  . Smokeless tobacco: Never Used  Vaping Use  . Vaping Use: Every day  . Substances: Nicotine, Flavoring  Substance Use Topics  . Alcohol use: Yes  . Drug use: Never  Allergies   Patient has no known allergies.   Review of Systems Review of Systems  Constitutional: Negative for chills and fever.  Cardiovascular: Negative for chest pain and leg swelling.  Gastrointestinal: Positive for abdominal pain, nausea and vomiting. Negative for diarrhea.  Genitourinary: Negative for dysuria, flank pain, frequency and hematuria.  Musculoskeletal: Negative for back pain.  Neurological: Negative for dizziness, light-headedness and headaches.     Physical Exam Triage Vital Signs ED Triage Vitals  Enc Vitals Group     BP 04/29/20 1228 111/77     Pulse Rate 04/29/20 1228 91     Resp 04/29/20 1228 15     Temp 04/29/20 1228 99.4 F (37.4 C)     Temp Source 04/29/20 1228 Oral     SpO2 04/29/20 1228 99 %     Weight 04/29/20 1233 162 lb (73.5 kg)     Height 04/29/20 1233 5\' 7"  (1.702 m)     Head  Circumference --      Peak Flow --      Pain Score 04/29/20 1233 2     Pain Loc --      Pain Edu? --      Excl. in GC? --    No data found.  Updated Vital Signs BP 111/77 (BP Location: Right Arm)   Pulse 91   Temp 99.4 F (37.4 C) (Oral)   Resp 15   Ht 5\' 7"  (1.702 m)   Wt 162 lb (73.5 kg)   LMP 04/15/2020   SpO2 99%   Breastfeeding No   BMI 25.37 kg/m   Visual Acuity Right Eye Distance:   Left Eye Distance:   Bilateral Distance:    Right Eye Near:   Left Eye Near:    Bilateral Near:     Physical Exam Vitals and nursing note reviewed.  Constitutional:      General: She is not in acute distress.    Appearance: Normal appearance. She is well-developed and well-nourished. She is not ill-appearing, toxic-appearing or diaphoretic.  HENT:     Head: Normocephalic and atraumatic.     Nose: Nose normal.  Eyes:     Extraocular Movements: EOM normal.  Cardiovascular:     Rate and Rhythm: Normal rate and regular rhythm.  Pulmonary:     Effort: Pulmonary effort is normal. No respiratory distress.     Breath sounds: Normal breath sounds.  Abdominal:     General: There is no distension.     Palpations: Abdomen is soft. There is no mass.     Tenderness: There is abdominal tenderness in the right upper quadrant and epigastric area. There is no right CVA tenderness, left CVA tenderness, guarding or rebound.     Hernia: No hernia is present.  Musculoskeletal:        General: Normal range of motion.     Cervical back: Normal range of motion.  Skin:    General: Skin is warm and dry.  Neurological:     Mental Status: She is alert and oriented to person, place, and time.  Psychiatric:        Mood and Affect: Mood and affect normal.        Behavior: Behavior normal.      UC Treatments / Results  Labs (all labs ordered are listed, but only abnormal results are displayed) Labs Reviewed  POCT URINALYSIS DIP (MANUAL ENTRY) - Abnormal; Notable for the following components:       Result Value   Clarity,  UA cloudy (*)    Ketones, POC UA moderate (40) (*)    Urobilinogen, UA 2.0 (*)    All other components within normal limits  COMPLETE METABOLIC PANEL WITH GFR  LIPASE  POCT URINE PREGNANCY  POCT CBC W AUTO DIFF ([facility])    EKG   Radiology US Abdomen Complete  Result Date: 04/29/2020 CLINICAL DATA:  Right upper quadrant pain EXAM: ABDOMEN ULTRASOUND COMPLETE COMPARISON:  None. FINDINGS: Gallbladder: No gallstones or wall thickening visualized. No sonographic Murphy sign noted by sonographer. Common bile duct: Diameter: 5.0 mm Liver: No focal lesion identified. Within normal limits in parenchymal echogenicity. Portal vein is patent on color Doppler imaging with normal direction of blood flow towards the liver. IVC: No abnormality visualized. Pancreas: Visualized portion unremarkable. Spleen: Size and appearance within normal limits. Right Kidney: Length: 11.0 cm. Echogenicity within normal limits. No mass or hydronephrosis visualized. Left Kidney: Length: 10.7 cm. Echogenicity within normal limits. No mass or hydronephrosis visualized. Abdominal aorta: No aneurysm visualized. Other findings: None. IMPRESSION: 1. Normal exam. Electronically Signed   By: Signa Kell M.D.   On: 04/29/2020 14:28    Procedures Procedures (including critical care time)  Medications Ordered in UC Medications - No data to display  Initial Impression / Assessment and Plan / UC Course  I have reviewed the triage vital signs and the nursing notes.  Pertinent labs & imaging results that were available during my care of the patient were reviewed by me and considered in my medical decision making (see chart for details).     Discussed imaging with pt Will start pt on trial of omeprazole for the indigestion. Pt declined antiemetics.  Encouraged f/u with PCP and/or GI specialist. Discussed symptoms that warrant emergent care in the ED. AVS and work note  provided  Final Clinical Impressions(s) / UC Diagnoses   Final diagnoses:  RUQ abdominal pain  Abdominal pain, epigastric  Nausea  Indigestion     Discharge Instructions      Call to schedule a follow up appointment with Primary care as well as a GI (stomach) specialist for further evaluation and treatment of symptoms.   Call 911 or have someone drive you to the hospital if symptoms significantly worsening.     ED Prescriptions    Medication Sig Dispense Auth. Provider   omeprazole (PRILOSEC) 20 MG capsule Take 1 capsule (20 mg total) by mouth daily. 30 capsule Lurene Shadow, [place]     PDMP not reviewed this encounter.   Lurene Shadow, PA-C 04/29/20 1437    Lurene Shadow, PA-C 04/29/20 1438

## 2020-04-30 LAB — COMPLETE METABOLIC PANEL WITH GFR
AG Ratio: 1.6 (calc) (ref 1.0–2.5)
ALT: 26 U/L (ref 6–29)
AST: 26 U/L (ref 10–30)
Albumin: 4.2 g/dL (ref 3.6–5.1)
Alkaline phosphatase (APISO): 89 U/L (ref 31–125)
BUN: 8 mg/dL (ref 7–25)
CO2: 27 mmol/L (ref 20–32)
Calcium: 9.2 mg/dL (ref 8.6–10.2)
Chloride: 104 mmol/L (ref 98–110)
Creat: 0.65 mg/dL (ref 0.50–1.10)
GFR, Est African American: 141 mL/min/{1.73_m2} (ref >=60)
GFR, Est Non African American: 122 mL/min/{1.73_m2} (ref >=60)
Globulin: 2.6 g/dL (calc) (ref 1.9–3.7)
Glucose, Bld: 79 mg/dL (ref 65–99)
Potassium: 3.8 mmol/L (ref 3.5–5.3)
Sodium: 139 mmol/L (ref 135–146)
Total Bilirubin: 0.4 mg/dL (ref 0.2–1.2)
Total Protein: 6.8 g/dL (ref 6.1–8.1)

## 2020-04-30 LAB — LIPASE: Lipase: 41 U/L (ref 7–60)

## 2020-05-17 ENCOUNTER — Ambulatory Visit: Payer: Self-pay

## 2020-05-18 ENCOUNTER — Emergency Department
Admission: RE | Admit: 2020-05-18 | Discharge: 2020-05-18 | Disposition: A | Discharge status: Home / Self Care | Payer: Medicaid Other | Source: Ambulatory Visit

## 2020-05-18 ENCOUNTER — Other Ambulatory Visit: Payer: Self-pay

## 2020-05-18 VITALS — BP 101/69 | HR 88 | Temp 99.1°F | Resp 15

## 2020-05-18 DIAGNOSIS — J069 Acute upper respiratory infection, unspecified: Secondary | ICD-10-CM

## 2020-05-18 NOTE — ED Triage Notes (Signed)
Sinus pressure to left side of face  NO COVID or flu vaccine Boyfriend tested positive for covid last Tuesday - last saw him on 05/09/20 Denies fever  C/o body aches, fatigue since Saturday

## 2020-05-18 NOTE — Discharge Instructions (Addendum)
Your COVID 19 results will be available in 3-5 days. Negative results are immediately resulted to Mychart. Positive results will receive a follow-up call from our clinic. If symptoms are present, I recommend home quarantine until results are known.  °

## 2020-05-18 NOTE — ED Provider Notes (Signed)
[facility]    CSN: [id number] Arrival date & time: 05/18/20  1201      History   Chief Complaint Chief Complaint  Patient presents with  . Appointment  . Facial Pain  . Covid Exposure    HPI Candace Ford is a 30 y.o. female.   HPI Patient presents with URI symptoms including body aches, left sided facial pressure, body aches fatigue. Known exposure COVID, boyfriend tested positive. Patient is unvaccinated against COVID. Denies worrisome symptoms of shortness of ath, weakness, N&V. Past Medical History:  Diagnosis Date  . Cholestasis during pregnancy   . Medical history non-contributory     Patient Active Problem List   Diagnosis Date Noted  . 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 Supervision of other normal pregnancy, antepartum 11/23/2017  . 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 Subchorionic hematoma 11/23/2017  . History of gestational diabetes 11/23/2017  . 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 NO PAST SURGERIES      OB History    Gravida  4   Para  3   Term  3   Preterm      AB  1   Living  3     SAB  1   IAB      Ectopic      Multiple      Live Births  1            Home Medications    Prior to Admission medications   Medication Sig Start Date End Date Taking? Authorizing Provider  omeprazole (PRILOSEC) 20 MG capsule Take 1 capsule (20 mg total) by mouth daily. 04/29/20  Yes Lurene Shadow, PA-C  acetaminophen (TYLENOL) 325 MG tablet Take 650 mg by mouth every 6 (six) hours as needed. Patient not taking: Reported on 04/29/2020    [provider]  cefdinir (OMNICEF) 300 MG capsule Take 2 capsules (600 mg total) by mouth daily. Patient not taking: Reported on 04/29/2020 02/17/20   Bing Neighbors, FNP  ferrous sulfate 325 (65 FE) MG tablet Take 1 tablet (325 mg total) by mouth daily with breakfast. Patient not taking: Reported on 04/29/2020  04/26/18   Rolm Bookbinder, CNM  ibuprofen (ADVIL,MOTRIN) 600 MG tablet Take 1 tablet (600 mg total) by mouth every 6 (six) hours as needed. Patient not taking: Reported on 04/29/2020 05/31/18   Arabella Merles, CNM  phenazopyridine (PYRIDIUM) 95 MG tablet Take 95 mg by mouth 3 (three) times daily as needed for pain. Patient not taking: Reported on 04/29/2020    [provider]    Family History Family History  Problem Relation Age of Onset  . Epilepsy Mother   . Heart attack Father   . Diabetes Maternal Grandmother   . Healthy Sister   . Healthy Brother     Social History Social History   Tobacco Use  . Smoking status: Former Smoker    Packs/day: 0.25    Types: Cigarettes  . Smokeless tobacco: Never Used  Vaping Use  . Vaping Use: Every day  . Substances: Nicotine, Flavoring  Substance Use Topics  . Alcohol use: Yes  . Drug use: Never   Allergies   Patient has no known allergies.   Review of Systems Review of Systems Pertinent negatives listed in HPI Physical Exam Triage Vital Signs ED Triage Vitals  Enc Vitals Group     BP 05/18/20 1240 101/69  Pulse Rate 05/18/20 1240 88     Resp 05/18/20 1240 15     Temp 05/18/20 1240 99.1 F (37.3 C)     Temp Source 05/18/20 1240 Oral     SpO2 05/18/20 1240 99 %     Weight --      Height --      Head Circumference --      Peak Flow --      Pain Score 05/18/20 1243 4     Pain Loc --      Pain Edu? --      Excl. in GC? --    No data found.  Updated Vital Signs BP 101/69 (BP Location: Right Arm)   Pulse 88   Temp 99.1 F (37.3 C) (Oral)   Resp 15   LMP 05/12/2020   SpO2 99%   Breastfeeding No   Visual Acuity Right Eye Distance:   Left Eye Distance:   Bilateral Distance:    Right Eye Near:   Left Eye Near:    Bilateral Near:     Physical Exam  General Appearance:    Alert, cooperative, no distress  HENT:   Normocephalic, ears normal, nares mucosal edema with congestion, rhinorrhea,  oropharynx    Eyes:    PERRL, conjunctiva/corneas clear, EOM's intact       Lungs:     Clear to auscultation bilaterally, respirations unlabored  Heart:    Regular rate and rhythm  Neurologic:   Awake, alert, oriented x 3. No apparent focal neurological           defect.      UC Treatments / Results  Labs (all labs ordered are listed, but only abnormal results are displayed) Labs Reviewed - No data to display  EKG   Radiology No results found.  Procedures Procedures (including critical care time)  Medications Ordered in UC Medications - No data to display  Initial Impression / Assessment and Plan / UC Course  I have reviewed the triage vital signs and the nursing notes.  Pertinent labs & imaging results that were available during my care of the patient were reviewed by me and considered in my medical decision making (see chart for details).    COVID/Flu test pending. Symptom management warranted only.  Manage fever with Tylenol and ibuprofen.  Nasal symptoms with over-the-counter antihistamines recommended.  Treatment per discharge medications/discharge instructions.  Red flags/ER precautions given. The most current CDC isolation/quarantine recommendation advised.   Final Clinical Impressions(s) / UC Diagnoses   Final diagnoses:  Viral URI with cough     Discharge Instructions     Your COVID 19 results will be available in 3-5 days.  Negative results are immediately resulted to Mychart. Positive results will receive a follow-up call from our clinic. If symptoms are present, I recommend home quarantine until results are known.     ED Prescriptions    None     PDMP not reviewed this encounter.   Bing Neighbors, FNP 05/24/20 0120

## 2020-05-20 LAB — COVID-19, FLU A+B NAA
Influenza A, NAA: NOT DETECTED
Influenza B, NAA: NOT DETECTED
SARS-CoV-2, NAA: NOT DETECTED

## 2020-06-15 DIAGNOSIS — U071 COVID-19: Secondary | ICD-10-CM

## 2020-06-15 HISTORY — DX: COVID-19: U07.1

## 2020-06-19 DIAGNOSIS — U071 COVID-19: Secondary | ICD-10-CM | POA: Diagnosis not present

## 2020-07-20 IMAGING — US US MFM FETAL BPP W/ NON-STRESS
1 series · 13 of 28 positions shown · non-contrast
Comparison: none

[Series 1: us mfm fetal bpp w/ non-stress · 44 acquisitions, 13 frames shown]
[im 2/44]
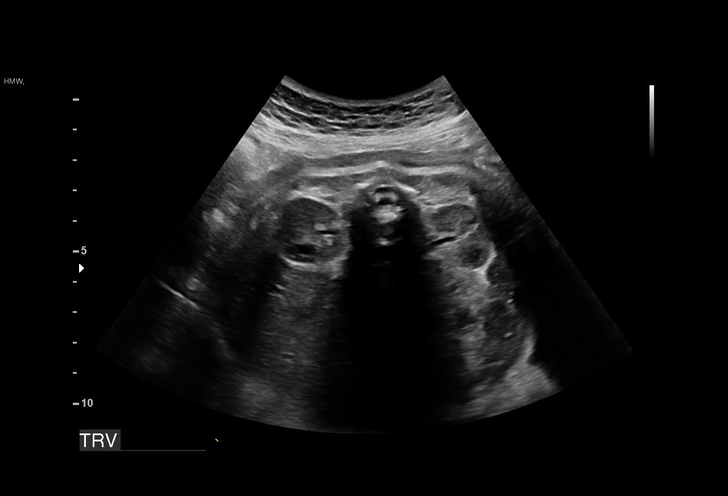
[im 5/44]
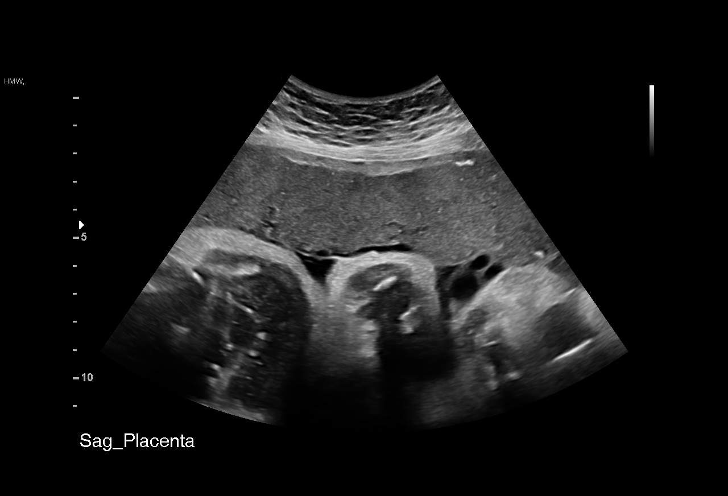
[im 8/44]
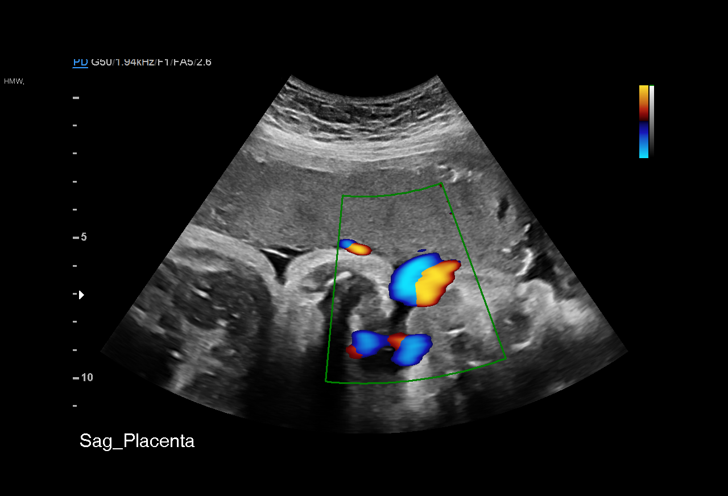
[im 12/44]
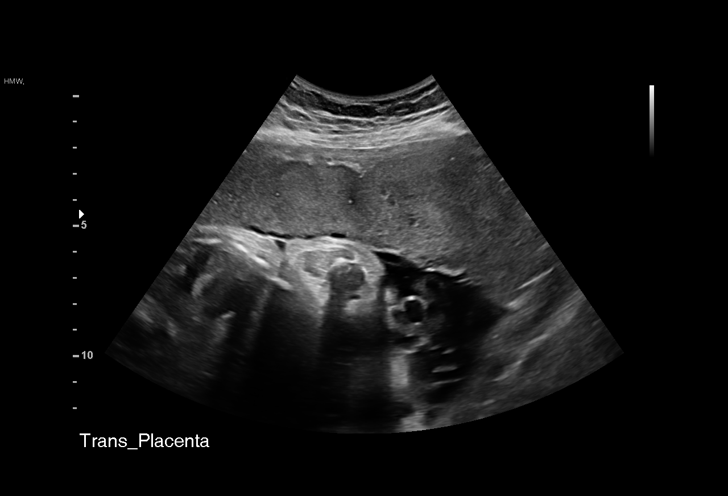
[im 15/44]
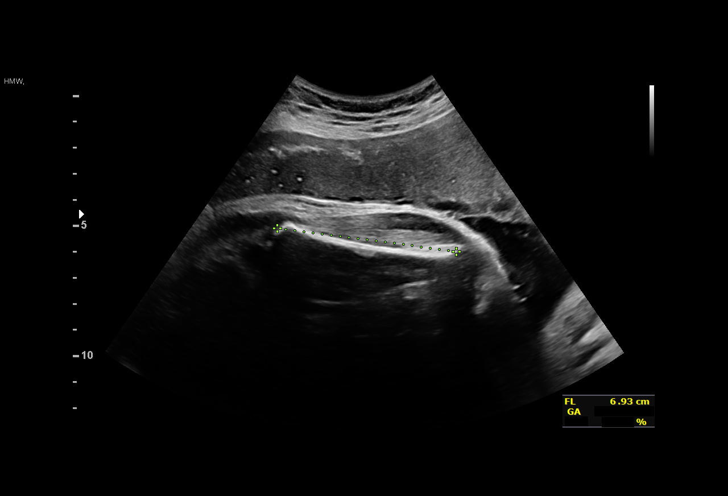
[im 18/44]
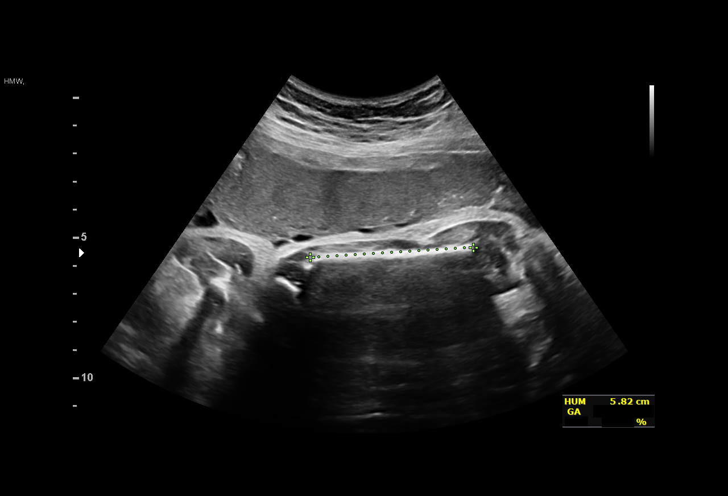
[im 23/44]
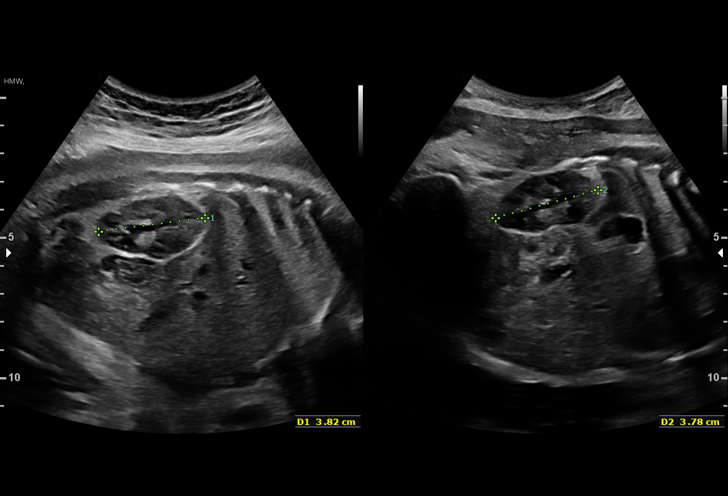
[im 26/44]
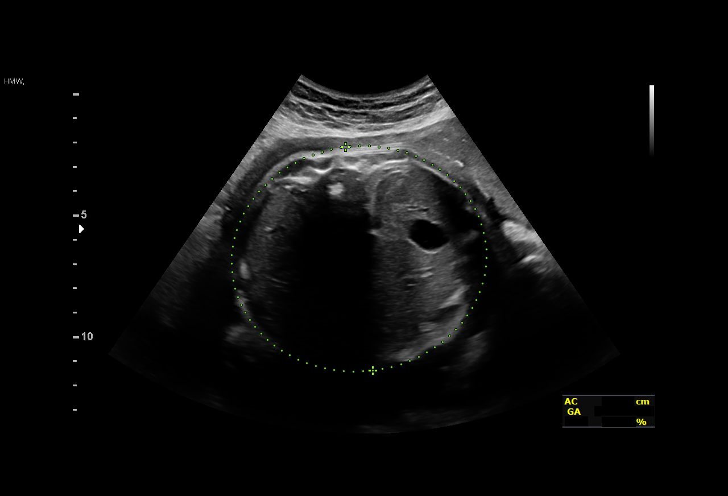
[im 29/44]
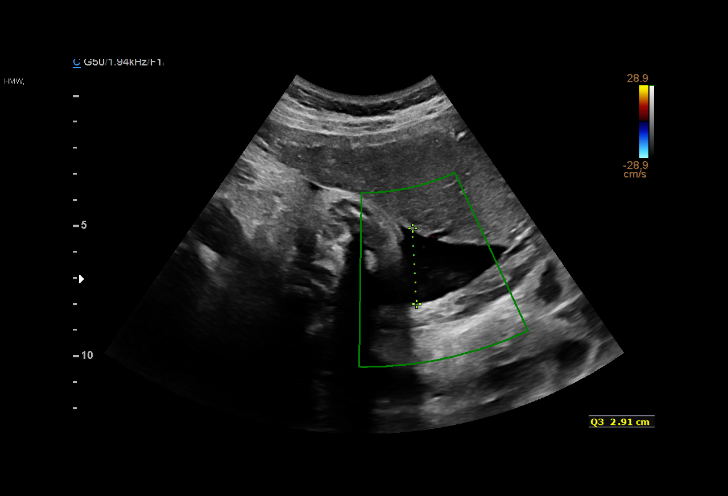
[im 32/44]
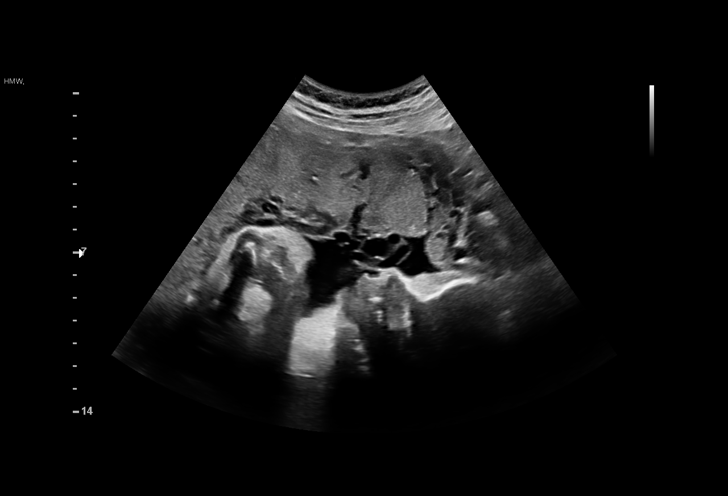
[im 36/44]
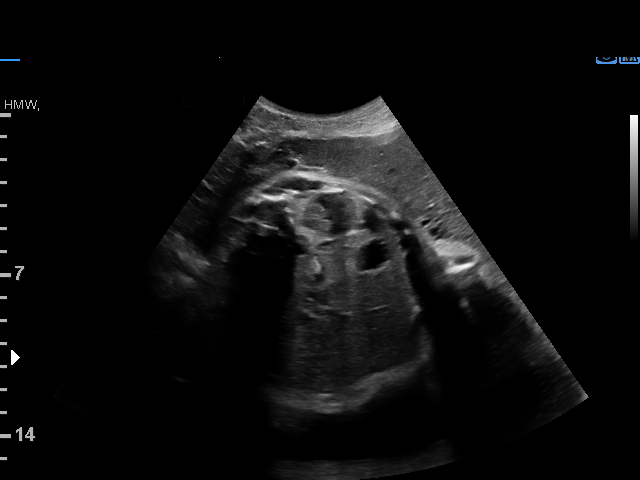
[im 39/44]
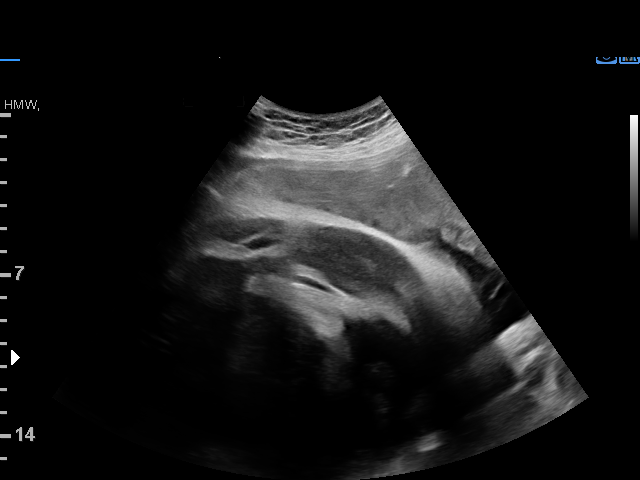
[im 42/44]
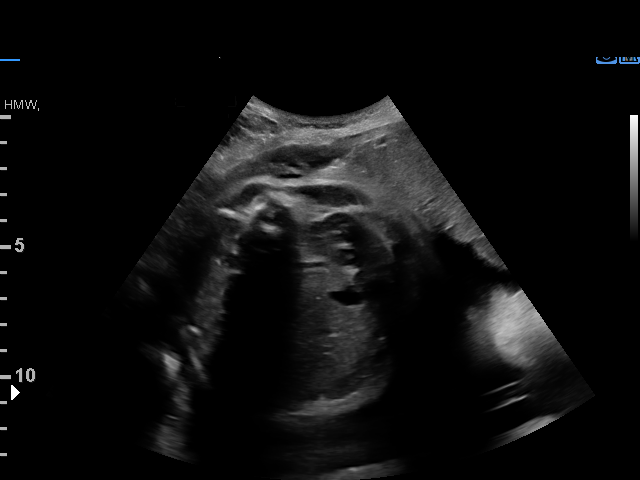

[13 of 28 positions shown; findings below may reference images not displayed]

for [REDACTED]care

     W/NONSTRESS
 ----------------------------------------------------------------------

 ----------------------------------------------------------------------
Indications

  35 weeks gestation of pregnancy
  Poor obstetric history: Previous gestational
  diabetes
  Tobacco use complicating pregnancy, third
  trimester
  Cholestasis of pregnancy, third trimester      [id number]
  Cystic Fibrosis (CF) Carrier, third trimester
  (FOB status uncertain)
 ----------------------------------------------------------------------
Fetal Evaluation

 Num Of Fetuses:         1
 Fetal Heart Rate(bpm):  148
 Cardiac Activity:       Observed
 Presentation:           Cephalic
 Placenta:               Anterior
 P. Cord Insertion:      Visualized, central

 Amniotic Fluid
 AFI FV:      Within normal limits

 AFI Sum(cm)     %Tile       Largest Pocket(cm)
 10.22           23

 RUQ(cm)       RLQ(cm)       LUQ(cm)        LLQ(cm)

Biophysical Evaluation

 Amniotic F.V:   Within normal limits       F. Tone:        Observed
 F. Movement:    Observed                   N.S.T:          Reactive
 F. Breathing:   Not Observed               Score:          [DATE]
Biometry

 BPD:      91.2  mm     G. Age:  37w 0d         87  %    CI:        81.25   %    70 - 86
                                                         FL/HC:      21.5   %    20.1 -
 HC:      319.4  mm     G. Age:  36w 0d         25  %    HC/AC:      1.02        0.93 -
 AC:      311.8  mm     G. Age:  35w 1d         42  %    FL/BPD:     75.4   %    71 - 87
 FL:       68.8  mm     G. Age:  35w 2d         35  %    FL/AC:      22.1   %    20 - 24
 HUM:      59.9  mm     G. Age:  34w 6d         45  %

 Est. FW:    6017  gm    5 lb 15 oz      58  %
OB History

 Gravidity:    4         Term:   2        Prem:   0        SAB:   1
 TOP:          0       Ectopic:  0        Living: 2
Gestational Age

 LMP:           35w 5d        Date:  09/12/17                 EDD:   06/19/18
 U/S Today:     35w 6d                                        EDD:   06/18/18
 Best:          35w 5d     Det. By:  LMP  (09/12/17)          EDD:   06/19/18
Anatomy

 Cranium:               Appears normal         LVOT:                   Previously seen
 Cavum:                 Previously seen        Aortic Arch:            Previously seen
 Ventricles:            Appears normal         Ductal Arch:            Not well visualized
 Choroid Plexus:        Previously seen        Diaphragm:              Previously seen
 Cerebellum:            Previously seen        Stomach:                Appears normal, left
                                                                       sided
 Posterior Fossa:       Previously seen        Abdomen:                Previously seen
 Nuchal Fold:           Previously seen        Abdominal Wall:         Previously seen
 Face:                  Orbits and profile     Cord Vessels:           Previously seen
                        previously seen
 Lips:                  Previously seen        Kidneys:                Appear normal
 Palate:                Not well visualized    Bladder:                Appears normal
 Thoracic:              Appears normal         Spine:                  Previously seen
 Heart:                 Previously seen        Upper Extremities:      Previously seen
 RVOT:                  Previously seen        Lower Extremities:      Previously seen

 Other:  Heels visualized previously. Nasal bone visualized previously.
         Technically difficult due to fetal position.
Cervix Uterus Adnexa

 Cervix
 Not visualized (advanced GA >40wks)

 Uterus
 No abnormality visualized.

 Left Ovary
 No adnexal mass visualized.

 Right Ovary
 No adnexal mass visualized.
 Cul De Sac
 No free fluid seen.

 Adnexa
 No abnormality visualized.
Impression

 Patient has a diagnosis of obstetric cholestasis and is taking
 ursodiol.
 Amniotic fluid is normal and good fetal activity is seen. Fetal
 growth is appropriate for gestational age. Fetal breathing
 movements did not meet the criteria. NST is reactive. BPP
 [DATE].
 Based on your diagnosis of cholestasis, I recommend
 delivery at 37 weeks.
Recommendations

 -Antenatal testing next week.
                 Jean Louis, [place]

## 2020-08-28 ENCOUNTER — Emergency Department
Admission: RE | Admit: 2020-08-28 | Discharge: 2020-08-28 | Disposition: A | Discharge status: Home / Self Care | Payer: Medicaid Other | Source: Ambulatory Visit

## 2020-08-28 ENCOUNTER — Other Ambulatory Visit: Payer: Self-pay

## 2020-08-28 VITALS — BP 121/81 | HR 92 | Temp 98.4°F | Resp 15

## 2020-08-28 DIAGNOSIS — J0140 Acute pansinusitis, unspecified: Secondary | ICD-10-CM

## 2020-08-28 DIAGNOSIS — J040 Acute laryngitis: Secondary | ICD-10-CM

## 2020-08-28 MED ORDER — AMOXICILLIN-POT CLAVULANATE 875-125 MG PO TABS: 1.0000 | ORAL_TABLET | Freq: Two times a day (BID) | ORAL | 0 refills | Status: AC

## 2020-08-28 NOTE — Discharge Instructions (Signed)
I have sent in Augmentin for you to take twice a day for 7 days.  Push fluids and get some rest  May take sudafed and use flonase OTC for symptomatic relief  Follow up with this office or with primary care if symptoms are persisting.  Follow up in the ER for high fever, trouble swallowing, trouble breathing, other concerning symptoms.

## 2020-08-28 NOTE — ED Provider Notes (Signed)
[facility]   [id number] 08/28/20 Arrival Time: 1055  DP:OEUM THROAT  SUBJECTIVE: History from: patient.  Candace Ford is a 30 y.o. female who presents with abrupt onset of nasal congestion, headache, sinus pressure, laryngitis for the last 6 days. Reports seasonal allergy symptoms for about a week prior. Denies sick exposure to Covid, strep, flu or mono, or precipitating event. Has positive history of Covid in 06/2020. Has not had Covid vaccines. Has not had flu vaccine this year. Has tried sudafed with mild temporary relief. There are no aggravating symptoms. Denies previous symptoms in the past.     Denies fever, chills, ear pain, cough, SOB, wheezing, chest pain, nausea, rash, changes in bowel or bladder habits.    ROS: As per HPI.  All other pertinent ROS negative.     Past Medical History:  Diagnosis Date  . Cholestasis during pregnancy   . Medical history non-contributory    Past Surgical History:  Procedure Laterality Date  . NO PAST SURGERIES     No Known Allergies No current facility-administered medications on file prior to encounter.   Current Outpatient Medications on File Prior to Encounter  Medication Sig Dispense Refill  . acetaminophen (TYLENOL) 325 MG tablet Take 650 mg by mouth every 6 (six) hours as needed. (Patient not taking: Reported on 04/29/2020)    . ferrous sulfate 325 (65 FE) MG tablet Take 1 tablet (325 mg total) by mouth daily with breakfast. (Patient not taking: Reported on 04/29/2020) 30 tablet 3  . ibuprofen (ADVIL,MOTRIN) 600 MG tablet Take 1 tablet (600 mg total) by mouth every 6 (six) hours as needed. (Patient not taking: Reported on 04/29/2020) 30 tablet 0  . omeprazole (PRILOSEC) 20 MG capsule Take 1 capsule (20 mg total) by mouth daily. 30 capsule 0  . phenazopyridine (PYRIDIUM) 95 MG tablet Take 95 mg by mouth 3 (three) times daily as needed for pain. (Patient not taking: Reported on 04/29/2020)     Social History    Socioeconomic History  . Marital status: Single    Spouse name: Not on file  . Number of children: Not on file  . Years of education: Not on file  . Highest education level: Not on file  Occupational History  . Occupation: waitress  Tobacco Use  . Smoking status: Former Smoker    Packs/day: 0.25    Types: Cigarettes  . Smokeless tobacco: Never Used  Vaping Use  . Vaping Use: Every day  . Substances: Nicotine, Flavoring  Substance and Sexual Activity  . Alcohol use: Yes  . Drug use: Never  . Sexual activity: Yes    Partners: Male    Birth control/protection: None  Other Topics Concern  . Not on file  Social History Narrative  . Not on file   Social Determinants of Health   Financial Resource Strain: Not on file  Food Insecurity: Not on file  Transportation Needs: Not on file  Physical Activity: Not on file  Stress: Not on file  Social Connections: Not on file  Intimate Partner Violence: Not on file   Family History  Problem Relation Age of Onset  . Epilepsy Mother   . Heart attack Father   . Diabetes Maternal Grandmother   . Healthy Sister   . Healthy Brother     OBJECTIVE:  Vitals:   08/28/20 1103  BP: 121/81  Pulse: 92  Resp: 15  Temp: 98.4 F (36.9 C)  TempSrc: Oral  SpO2: 96%     General appearance:  alert; appears fatigued, but nontoxic, speaking in full sentences and managing own secretions HEENT: NCAT; Ears: EACs clear, TMs pearly gray with visible cone of light, without erythema; Eyes: PERRL, EOMI grossly; Nose: no obvious rhinorrhea; Throat: oropharynx mildly erythematous, cobblestoning present, tonsils 1+ and mildly erythematous without white tonsillar exudates, uvula midline; Sinuses: tender to palpation Neck: supple with LAD Lungs: CTA bilaterally without adventitious breath sounds; cough absent Heart: regular rate and rhythm.  Radial pulses 2+ symmetrical bilaterally Skin: warm and dry Psychological: alert and cooperative; normal mood  and affect  LABS: No results found for this or any previous visit (from the past 24 hour(s)).   ASSESSMENT & PLAN:  1. Acute non-recurrent pansinusitis   2. Laryngitis     Meds ordered this encounter  Medications  . amoxicillin-clavulanate (AUGMENTIN) 875-125 MG tablet    Sig: Take 1 tablet by mouth 2 (two) times daily for 7 days.    Dispense:  14 tablet    Refill:  0    Order Specific Question:   Supervising Provider    Answer:   Merrilee Jansky [id number]    Acute Sinusitis Push fluids and get rest Prescribed amoxicillin 875mg  twice daily for 7 days.   Take as directed and to completion.  Drink warm or cool liquids, use throat lozenges, or popsicles to help alleviate symptoms Take OTC ibuprofen or tylenol as needed for pain May use Zyrtec D and flonase to help alleviate symptoms Follow up with PCP if symptoms persist Return or go to ER if you have any new or worsening symptoms such as fever, chills, nausea, vomiting, worsening sore throat, cough, abdominal pain, chest pain, changes in bowel or bladder habits.   Reviewed expectations re: course of current medical issues. Questions answered. Outlined signs and symptoms indicating need for more acute intervention. Patient verbalized understanding. After Visit Summary given.          , NP 08/28/20 1120

## 2020-08-28 NOTE — ED Triage Notes (Signed)
congestion & hoarse voice since Monday OTC sudafed  Sinus pressure  Denies fever COVID 06/2020 No COVID vaccine

## 2020-11-11 ENCOUNTER — Ambulatory Visit: Payer: Medicaid Other

## 2020-12-24 ENCOUNTER — Emergency Department: Admit: 2020-12-24 | Discharge status: Absent without leave | Payer: Self-pay

## 2020-12-31 DIAGNOSIS — N3000 Acute cystitis without hematuria: Secondary | ICD-10-CM | POA: Diagnosis not present

## 2020-12-31 DIAGNOSIS — R82998 Other abnormal findings in urine: Secondary | ICD-10-CM | POA: Diagnosis not present

## 2021-01-05 ENCOUNTER — Emergency Department: Admit: 2021-01-05 | Discharge status: Absent without leave | Payer: Self-pay

## 2021-01-05 ENCOUNTER — Emergency Department
Admission: EM | Admit: 2021-01-05 | Discharge: 2021-01-05 | Disposition: A | Discharge status: Home / Self Care | Payer: Medicaid Other | Source: Home / Self Care | Attending: Family Medicine | Admitting: Family Medicine

## 2021-01-05 ENCOUNTER — Other Ambulatory Visit (HOSPITAL_COMMUNITY)
Admission: RE | Admit: 2021-01-05 | Discharge: 2021-01-05 | Disposition: A | Discharge status: Home / Self Care | Payer: Medicaid Other | Source: Ambulatory Visit | Attending: Family Medicine | Admitting: Family Medicine

## 2021-01-05 ENCOUNTER — Other Ambulatory Visit: Payer: Self-pay

## 2021-01-05 DIAGNOSIS — N760 Acute vaginitis: Secondary | ICD-10-CM

## 2021-01-05 DIAGNOSIS — Z7689 Persons encountering health services in other specified circumstances: Secondary | ICD-10-CM

## 2021-01-05 DIAGNOSIS — A5602 Chlamydial vulvovaginitis: Secondary | ICD-10-CM | POA: Insufficient documentation

## 2021-01-05 DIAGNOSIS — N898 Other specified noninflammatory disorders of vagina: Secondary | ICD-10-CM | POA: Diagnosis present

## 2021-01-05 DIAGNOSIS — B9689 Other specified bacterial agents as the cause of diseases classified elsewhere: Secondary | ICD-10-CM | POA: Diagnosis not present

## 2021-01-05 DIAGNOSIS — Z202 Contact with and (suspected) exposure to infections with a predominantly sexual mode of transmission: Secondary | ICD-10-CM | POA: Diagnosis not present

## 2021-01-05 LAB — POCT URINALYSIS DIP (MANUAL ENTRY)
Bilirubin, UA: NEGATIVE
Glucose, UA: NEGATIVE mg/dL
Ketones, POC UA: NEGATIVE mg/dL
Nitrite, UA: NEGATIVE
Spec Grav, UA: 1.015 (ref 1.010–1.025)
Urobilinogen, UA: 0.2 E.U./dL
pH, UA: 5.5 (ref 5.0–8.0)

## 2021-01-05 MED ORDER — METRONIDAZOLE 500 MG PO TABS: 500.0000 mg | ORAL_TABLET | Freq: Two times a day (BID) | ORAL | 0 refills | Status: DC

## 2021-01-05 MED ORDER — FLUCONAZOLE 150 MG PO TABS: 150.0000 mg | ORAL_TABLET | Freq: Every day | ORAL | 0 refills | Status: DC

## 2021-01-05 NOTE — ED Triage Notes (Signed)
Pt c/o vaginal itching and burning. Was seen and tx for UTI a couple weeks ago. Thinks she may have a yeast infection. Has had two new sexual partners in last 5 months. Would also like bloodwork for additional STDs.

## 2021-01-05 NOTE — Discharge Instructions (Addendum)
You have been prescribed metronidazole, 1 pill 2 times a day.  This treats bacterial vaginosis I have also prescribed Diflucan.  This will treat yeast infection  Your test results will be available on MyChart.  You will be called if any of your test results are positive

## 2021-01-05 NOTE — ED Provider Notes (Signed)
[facility]    CSN: [id number] Arrival date & time: 01/05/21  1153      History   Chief Complaint Chief Complaint  Patient presents with   Vaginal Itching    HPI Candace Ford is a 30 y.o. female.   HPI  Patient states she has had 2 sexual partners in the last 5 months.  She has had unprotected sexual relations.  She is here requesting sexually transmitted disease testing.  She has unknown exposure to disease. She recently was seen in at a different urgent care.  She was treated with Septra for a bladder infection.  She states her bladder infection symptoms have improved but she has developed a vaginitis.  She has a thick discharge with severe itching and irritation.  Some swelling of the outer genitals.  She also has an odor.  She does not recall that she has had BV or yeast infections in the past. 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 Authorizing Provider  fluconazole (DIFLUCAN) 150 MG tablet Take 1 tablet (150 mg total) by mouth daily. Repeat in 1 week if needed 01/05/21  Yes Eustace Moore, MD  metroNIDAZOLE (FLAGYL) 500 MG tablet  Take 1 tablet (500 mg total) by mouth 2 (two) times daily. 01/05/21  Yes Eustace Moore, MD    Family History Family History  Problem Relation Age of Onset   Epilepsy Mother    Heart attack Father    Diabetes Maternal Grandmother    Healthy Sister    Healthy Brother     Social History Social History   Tobacco Use   Smoking status: Former    Packs/day: 0.25    Types: Cigarettes   Smokeless tobacco: Never  Vaping Use   Vaping Use: Every day   Substances: Nicotine, Flavoring  Substance Use Topics   Alcohol use: Yes   Drug use: Never     Allergies   Patient has no known allergies.   Review of Systems Review of Systems See HPI  Physical Exam Triage Vital Signs ED Triage Vitals  Enc Vitals Group     BP 01/05/21 1201 123/81     Pulse Rate 01/05/21 1201 91     Resp 01/05/21 1201 17     Temp 01/05/21 1201 98.3 F (36.8 C)     Temp Source 01/05/21 1201 Oral  SpO2 01/05/21 1201 99 %     Weight --      Height --      Head Circumference --      Peak Flow --      Pain Score 01/05/21 1246 0     Pain Loc --      Pain Edu? --      Excl. in GC? --    No data found.  Updated Vital Signs BP 123/81 (BP Location: Right Arm)   Pulse 91   Temp 98.3 F (36.8 C) (Oral)   Resp 17   LMP 01/03/2021   SpO2 99%     Physical Exam Constitutional:      General: She is not in acute distress.    Appearance: She is well-developed.  HENT:     Head: Normocephalic and atraumatic.  Eyes:     Conjunctiva/sclera: Conjunctivae normal.     Pupils: Pupils are equal, round, and reactive to light.  Cardiovascular:     Rate and Rhythm: Normal rate.  Pulmonary:     Effort: Pulmonary effort is normal. No respiratory distress.  Abdominal:     Palpations: Abdomen is soft.  Musculoskeletal:        General: Normal range of motion.     Cervical back: Normal range of motion.  Skin:    General: Skin is warm and dry.  Neurological:     Mental Status: She is alert.  Psychiatric:         Mood and Affect: Mood normal.        Behavior: Behavior normal.     UC Treatments / Results  Labs (all labs ordered are listed, but only abnormal results are displayed) Labs Reviewed  POCT URINALYSIS DIP (MANUAL ENTRY) - Abnormal; Notable for the following components:      Result Value   Blood, UA large (*)    Protein Ur, POC trace (*)    Leukocytes, UA Trace (*)    All other components within normal limits  RPR  HIV ANTIBODY (ROUTINE TESTING W REFLEX)  CERVICOVAGINAL ANCILLARY ONLY    EKG   Radiology No results found.  Procedures Procedures (including critical care time)  Medications Ordered in UC Medications - No data to display  Initial Impression / Assessment and Plan / UC Course  I have reviewed the triage vital signs and the nursing notes.  Pertinent labs & imaging results that were available during my care of the patient were reviewed by me and considered in my medical decision making (see chart for details).     Already treat the patient for acute vaginitis based on her symptoms.  We will give her both coverage for yeast and BV.  Check MyChart for test results. Final Clinical Impressions(s) / UC Diagnoses   Final diagnoses:  Encounter for assessment of STD exposure  Acute vaginitis     Discharge Instructions      You have been prescribed metronidazole, 1 pill 2 times a day.  This treats bacterial vaginosis I have also prescribed Diflucan.  This will treat yeast infection  Your test results will be available on MyChart.  You will be called if any of your test results are positive   ED Prescriptions     Medication Sig Dispense Auth. Provider   metroNIDAZOLE (FLAGYL) 500 MG tablet Take 1 tablet (500 mg total) by mouth 2 (two) times daily. 14 tablet Eustace Moore, MD   fluconazole (DIFLUCAN) 150 MG tablet Take 1 tablet (150 mg  total) by mouth daily. Repeat in 1 week if needed 2 tablet Eustace Moore, MD      PDMP not reviewed this  encounter.   Eustace Moore, MD 01/05/21 1254

## 2021-01-06 LAB — CERVICOVAGINAL ANCILLARY ONLY
Bacterial Vaginitis (gardnerella): POSITIVE — AB
Candida Glabrata: NEGATIVE
Candida Vaginitis: NEGATIVE
Chlamydia: POSITIVE — AB
Comment: NEGATIVE
Comment: NEGATIVE
Comment: NEGATIVE
Comment: NEGATIVE
Comment: NEGATIVE
Comment: NORMAL
Neisseria Gonorrhea: NEGATIVE
Trichomonas: NEGATIVE

## 2021-01-06 LAB — HIV ANTIBODY (ROUTINE TESTING W REFLEX): HIV 1&2 Ab, 4th Generation: NONREACTIVE

## 2021-01-06 LAB — RPR: RPR Ser Ql: NONREACTIVE

## 2021-01-07 ENCOUNTER — Other Ambulatory Visit: Payer: Self-pay

## 2021-01-07 ENCOUNTER — Telehealth: Payer: Self-pay

## 2021-01-07 ENCOUNTER — Emergency Department
Admission: EM | Admit: 2021-01-07 | Discharge: 2021-01-07 | Disposition: A | Discharge status: Home / Self Care | Payer: Medicaid Other | Source: Home / Self Care

## 2021-01-07 MED ORDER — AZITHROMYCIN 1 G PO PACK
1.0000 g | PACK | Freq: Once | ORAL | Status: AC
  Administered 2021-01-07: 1 g via ORAL

## 2021-01-07 NOTE — ED Triage Notes (Signed)
Pt came in to be treated for positive lab of Chlamydia.

## 2021-01-07 NOTE — Telephone Encounter (Signed)
Pt called concerning treatment for positive labs results. Staff informed patient that she would have the provider to send medication to pharmacy of her choice. Pt was advise that medication would be sent to her pharmacy on file.

## 2021-01-31 ENCOUNTER — Emergency Department: Admit: 2021-01-31 | Discharge status: Absent without leave | Payer: Self-pay

## 2021-01-31 ENCOUNTER — Emergency Department
Admission: EM | Admit: 2021-01-31 | Discharge: 2021-01-31 | Disposition: A | Discharge status: Home / Self Care | Payer: Medicaid Other | Source: Home / Self Care

## 2021-01-31 ENCOUNTER — Other Ambulatory Visit: Payer: Self-pay

## 2021-01-31 ENCOUNTER — Other Ambulatory Visit (HOSPITAL_COMMUNITY)
Admission: RE | Admit: 2021-01-31 | Discharge: 2021-01-31 | Disposition: A | Discharge status: Home / Self Care | Payer: Medicaid Other | Source: Ambulatory Visit | Attending: Family Medicine | Admitting: Family Medicine

## 2021-01-31 DIAGNOSIS — Z202 Contact with and (suspected) exposure to infections with a predominantly sexual mode of transmission: Secondary | ICD-10-CM | POA: Diagnosis not present

## 2021-01-31 NOTE — ED Provider Notes (Signed)
[facility]    CSN: [id number] Arrival date & time: 01/31/21  1435      History   Chief Complaint Chief Complaint  Patient presents with   SEXUALLY TRANSMITTED DISEASE    HPI Candace Ford is a 30 y.o. female.   HPI 30 year old female presents with potential exposure to STD.  Patient was evaluated for STD exposure here on 01/01/2021.  Patient reports she tested positive for chlamydia at 01/01/2021 visit and would like to be sure she is still negative for chlamydia.  Patient denies any current symptoms at this time.  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 Authorizing Provider  fluconazole (DIFLUCAN) 150 MG tablet Take 1 tablet (150 mg total) by mouth daily. Repeat in 1 week if needed 01/05/21   Eustace Moore, MD  metroNIDAZOLE (FLAGYL) 500 MG tablet Take 1 tablet (500 mg total) by mouth 2 (two) times daily. 01/05/21   Eustace Moore, MD    Family History Family History  Problem Relation Age of Onset   Epilepsy Mother    Heart attack Father    Diabetes Maternal Grandmother    Healthy Sister    Healthy Brother     Social History Social History   Tobacco Use   Smoking status: Former     Packs/day: 0.25    Types: Cigarettes   Smokeless tobacco: Never  Vaping Use   Vaping Use: Every day   Substances: Nicotine, Flavoring  Substance Use Topics   Alcohol use: Yes   Drug use: Never     Allergies   Patient has no known allergies.   Review of Systems Review of Systems  Genitourinary:        Potential STD exposure  All other systems reviewed and are negative.   Physical Exam Triage Vital Signs ED Triage Vitals  Enc Vitals Group     BP      Pulse      Resp      Temp      Temp src      SpO2      Weight      Height      Head Circumference      Peak Flow      Pain Score      Pain Loc      Pain Edu?      Excl. in GC?    No data found.  Updated  Vital Signs BP 108/74 (BP Location: Left Arm)   Pulse 96   Temp 98.9 F (37.2 C) (Oral)   Resp 16   LMP 01/03/2021   SpO2 100%   Physical Exam Vitals and nursing note reviewed.  Constitutional:      General: She is not in acute distress.    Appearance: Normal appearance. She is normal weight. She is not ill-appearing.  HENT:     Head: Normocephalic and atraumatic.     Mouth/Throat:     Mouth: Mucous membranes are moist.     Pharynx: Oropharynx is clear.  Eyes:     Extraocular Movements: Extraocular movements intact.     Conjunctiva/sclera: Conjunctivae normal.     Pupils: Pupils are equal, round, and reactive to light.  Cardiovascular:     Rate and Rhythm: Normal rate and regular rhythm.     Pulses: Normal pulses.     Heart sounds: Normal heart sounds.  Pulmonary:     Effort: Pulmonary effort is normal.     Breath sounds: Normal breath sounds. No wheezing, rhonchi or rales.  Abdominal:     General: Bowel sounds are normal.  Musculoskeletal:        General: Normal range of motion.     Cervical back: Normal range of motion and neck supple.  Skin:    General: Skin is warm and dry.  Neurological:     General: No focal deficit present.     Mental Status: She is alert and oriented to person,  place, and time. Mental status is at baseline.  Psychiatric:        Mood and Affect: Mood normal.        Behavior: Behavior normal.        Thought Content: Thought content normal.     UC Treatments / Results  Labs (all labs ordered are listed, but only abnormal results are displayed) Labs Reviewed  CERVICOVAGINAL ANCILLARY ONLY    EKG   Radiology No results found.  Procedures Procedures (including critical care time)  Medications Ordered in UC Medications - No data to display  Initial Impression / Assessment and Plan / UC Course  I have reviewed the triage vital signs and the nursing notes.  Pertinent labs & imaging results that were available during my care of the patient were reviewed by me and considered in my medical decision making (see chart for details).     MDM: 1.  Potential exposure to STD-Aptima swab ordered. Advised/instructed patient we would follow-up with her once lab results are returned.  Discharged home, hemodynamically stable. Final Clinical Impressions(s) / UC Diagnoses   Final diagnoses:  Potential exposure to STD     Discharge Instructions      Advised/instructed patient we would follow-up with her once lab results are returned.     ED Prescriptions   None    PDMP not reviewed this encounter.   Trevor Iha, FNP 01/31/21 1521

## 2021-01-31 NOTE — ED Triage Notes (Addendum)
Pt present STD screening, pt recently tested positive for Chlamydia. Pt came back in to get re tested to make sure she is negative. Pt denies having any symptoms at this time

## 2021-01-31 NOTE — Discharge Instructions (Addendum)
Advised/instructed patient we would follow-up with her once lab results are returned.

## 2021-02-02 ENCOUNTER — Telehealth (HOSPITAL_COMMUNITY): Payer: Self-pay | Admitting: Emergency Medicine

## 2021-02-02 LAB — CERVICOVAGINAL ANCILLARY ONLY
Bacterial Vaginitis (gardnerella): NEGATIVE
Candida Glabrata: POSITIVE — AB
Candida Vaginitis: POSITIVE — AB
Chlamydia: NEGATIVE
Comment: NEGATIVE
Comment: NEGATIVE
Comment: NEGATIVE
Comment: NEGATIVE
Comment: NEGATIVE
Comment: NORMAL
Neisseria Gonorrhea: NEGATIVE
Trichomonas: NEGATIVE

## 2021-02-02 MED ORDER — FLUCONAZOLE 150 MG PO TABS: 150.0000 mg | ORAL_TABLET | Freq: Once | ORAL | 0 refills | Status: AC

## 2021-02-23 ENCOUNTER — Emergency Department: Admit: 2021-02-23 | Discharge status: Absent without leave | Payer: Self-pay

## 2021-02-28 ENCOUNTER — Emergency Department (INDEPENDENT_AMBULATORY_CARE_PROVIDER_SITE_OTHER)
Admission: EM | Admit: 2021-02-28 | Discharge: 2021-02-28 | Disposition: A | Discharge status: Home / Self Care | Payer: Medicaid Other | Source: Home / Self Care

## 2021-02-28 ENCOUNTER — Other Ambulatory Visit (HOSPITAL_COMMUNITY)
Admission: RE | Admit: 2021-02-28 | Discharge: 2021-02-28 | Disposition: A | Discharge status: Home / Self Care | Payer: Medicaid Other | Source: Ambulatory Visit | Attending: Family Medicine | Admitting: Family Medicine

## 2021-02-28 ENCOUNTER — Other Ambulatory Visit: Payer: Self-pay

## 2021-02-28 DIAGNOSIS — N898 Other specified noninflammatory disorders of vagina: Secondary | ICD-10-CM

## 2021-02-28 NOTE — ED Triage Notes (Addendum)
Pt c/o abnormal vaginal discharge x 10 days. Was seen last month for STD screening. Denies urinary sxs. Wants to retest. Tx for yeast infection. Says she took one and didn't take the other. Thinks its returned.

## 2021-02-28 NOTE — Discharge Instructions (Addendum)
Advised/instructed patient we will follow-up with Aptima swab results once returned.

## 2021-02-28 NOTE — ED Provider Notes (Signed)
[facility]    CSN: [id number] Arrival date & time: 02/28/21  1037      History   Chief Complaint Chief Complaint  Patient presents with   Vaginal Discharge    HPI Candace Ford is a 30 y.o. female.   HPI patient complaining abnormal vaginal discharge x10 days.  Was evaluated here by me on 09/19 for STD screening.  Patient request test for vaginal candidiasis.  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 Authorizing Provider  metroNIDAZOLE (FLAGYL) 500 MG tablet Take 1 tablet (500 mg total) by mouth 2 (two) times daily. 01/05/21   Eustace Moore, MD    Family History Family History  Problem Relation Age of Onset   Epilepsy Mother    Heart attack Father    Diabetes Maternal Grandmother    Healthy Sister    Healthy Brother     Social History Social History   Tobacco Use   Smoking status: Former    Packs/day: 0.25    Types: Cigarettes   Smokeless tobacco: Never  Vaping Use   Vaping Use: Every day   Substances: Nicotine, Flavoring  Substance Use Topics   Alcohol use: Yes   Drug use: Never     Allergies   Patient has no known allergies.   Review of Systems Review of Systems   Genitourinary:  Positive for vaginal discharge.  All other systems reviewed and are negative.   Physical Exam Triage Vital Signs ED Triage Vitals  Enc Vitals Group     BP 02/28/21 1113 107/74     Pulse Rate 02/28/21 1113 81     Resp 02/28/21 1113 17     Temp 02/28/21 1113 98.8 F (37.1 C)     Temp Source 02/28/21 1113 Oral     SpO2 02/28/21 1113 99 %     Weight --      Height --      Head Circumference --      Peak Flow --      Pain Score 02/28/21 1114 0     Pain Loc --      Pain Edu? --      Excl. in GC? --    No data found.  Updated Vital Signs BP 107/74 (BP Location: Right Arm)   Pulse 81   Temp 98.8 F (37.1 C) (Oral)   Resp 17   LMP 02/04/2021 (Exact Date)   SpO2  99%    Physical Exam Vitals and nursing note reviewed.  Constitutional:      General: She is not in acute distress.    Appearance: Normal appearance. She is normal weight. She is not ill-appearing.  HENT:     Head: Normocephalic and atraumatic.     Mouth/Throat:     Mouth: Mucous membranes are moist.     Pharynx: Oropharynx is clear.  Eyes:     Extraocular Movements: Extraocular movements intact.     Conjunctiva/sclera: Conjunctivae normal.     Pupils: Pupils are equal, round, and reactive to light.  Cardiovascular:     Rate and Rhythm: Normal rate and regular rhythm.     Pulses: Normal pulses.     Heart sounds: Normal heart sounds.  Pulmonary:     Effort: Pulmonary effort is normal.     Breath sounds: Normal breath sounds.     Comments: No adventitious breath sounds noted Abdominal:     Tenderness: There is no right CVA tenderness or left CVA tenderness.  Musculoskeletal:        General: Normal range of motion.     Cervical back: Normal range of motion and neck supple.  Skin:    General: Skin is warm and dry.  Neurological:     General: No focal deficit present.     Mental Status: She is alert and oriented to person, place, and time. Mental status is at baseline.  Psychiatric:         Mood and Affect: Mood normal.        Behavior: Behavior normal.        Thought Content: Thought content normal.     UC Treatments / Results  Labs (all labs ordered are listed, but only abnormal results are displayed) Labs Reviewed  CERVICOVAGINAL ANCILLARY ONLY    EKG   Radiology No results found.  Procedures Procedures (including critical care time)  Medications Ordered in UC Medications - No data to display  Initial Impression / Assessment and Plan / UC Course  I have reviewed the triage vital signs and the nursing notes.  Pertinent labs & imaging results that were available during my care of the patient were reviewed by me and considered in my medical decision making (see chart for details).     MDM: 1.  Vaginal discharge-Aptima swab ordered. Advised/instructed patient we will follow-up with Aptima swab results once returned.  Patient discharged home, hemodynamically stable. Final Clinical Impressions(s) / UC Diagnoses   Final diagnoses:  Vaginal discharge     Discharge Instructions      Advised/instructed patient we will follow-up with Aptima swab results once returned.     ED Prescriptions   None    PDMP not reviewed this encounter.   Trevor Iha, FNP 02/28/21 1318

## 2021-03-01 LAB — CERVICOVAGINAL ANCILLARY ONLY
Bacterial Vaginitis (gardnerella): POSITIVE — AB
Candida Glabrata: NEGATIVE
Candida Vaginitis: NEGATIVE
Chlamydia: NEGATIVE
Comment: NEGATIVE
Comment: NEGATIVE
Comment: NEGATIVE
Comment: NEGATIVE
Comment: NEGATIVE
Comment: NORMAL
Neisseria Gonorrhea: NEGATIVE
Trichomonas: NEGATIVE

## 2021-03-02 ENCOUNTER — Telehealth (HOSPITAL_COMMUNITY): Payer: Self-pay | Admitting: Emergency Medicine

## 2021-03-02 MED ORDER — METRONIDAZOLE 500 MG PO TABS: 500.0000 mg | ORAL_TABLET | Freq: Two times a day (BID) | ORAL | 0 refills | Status: DC

## 2021-06-27 ENCOUNTER — Other Ambulatory Visit: Payer: Self-pay

## 2021-06-27 ENCOUNTER — Emergency Department (INDEPENDENT_AMBULATORY_CARE_PROVIDER_SITE_OTHER)
Admission: RE | Admit: 2021-06-27 | Discharge: 2021-06-27 | Disposition: A | Discharge status: Home / Self Care | Payer: Medicaid Other | Source: Ambulatory Visit | Attending: Family Medicine | Admitting: Family Medicine

## 2021-06-27 ENCOUNTER — Inpatient Hospital Stay: Admission: RE | Admit: 2021-06-27 | Payer: Medicaid Other | Source: Ambulatory Visit

## 2021-06-27 VITALS — BP 111/80 | HR 81 | Temp 98.4°F | Resp 14 | Wt 160.0 lb

## 2021-06-27 DIAGNOSIS — J40 Bronchitis, not specified as acute or chronic: Secondary | ICD-10-CM

## 2021-06-27 MED ORDER — AZITHROMYCIN 250 MG PO TABS: ORAL_TABLET | ORAL | 0 refills | Status: DC

## 2021-06-27 MED ORDER — PREDNISONE 20 MG PO TABS: 20.0000 mg | ORAL_TABLET | Freq: Two times a day (BID) | ORAL | 0 refills | Status: DC

## 2021-06-27 NOTE — ED Triage Notes (Signed)
Pt presents with scratchy throat, body aches, cough, and sob that began Thursday. No known fever

## 2021-06-27 NOTE — Discharge Instructions (Signed)
Take the prednisone 40 mg a day for 5 days.  Take 2 pills today Drink lots of water Run a humidifier in your room if you have 1 Take a cough medicine such as Mucinex DM or Delsym every 12 hours If you fail to improve in the next couple of days fill and take the Z-Pak

## 2021-06-27 NOTE — ED Provider Notes (Signed)
[facility]    CSN: [id number] Arrival date & time: 06/27/21  1907      History   Chief Complaint Chief Complaint  Patient presents with   Cough   Sore Throat   Generalized Body Aches    HPI Candace Ford is a 31 y.o. female.   HPI Patient's been sick with a cough for a week.  Some runny stuffy nose.  Scratchy throat.  Coughing with chest pain.  She coughs until she gags.  She states that her chest hurts from all the coughing.  She sometimes feels unable to take a deep breath.  Sometimes feels short of breath.  No underlying asthma or emphysema.  She used to be a smoker but quit sometime ago.  No exposure to illness.  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 Authorizing Provider  azithromycin (ZITHROMAX Z-PAK) 250 MG tablet Take two pills today followed by one a day until gone 06/27/21  Yes Eustace Moore, MD  predniSONE (DELTASONE) 20 MG tablet Take 1 tablet (20 mg total) by mouth 2 (two) times daily with a meal. 06/27/21  Yes Eustace Moore, MD    Family History Family History  Problem Relation Age of Onset   Epilepsy Mother    Heart attack Father    Diabetes Maternal Grandmother    Healthy  Sister    Healthy Brother     Social History Social History   Tobacco Use   Smoking status: Former    Packs/day: 0.25    Types: Cigarettes   Smokeless tobacco: Never  Vaping Use   Vaping Use: Every day   Substances: Nicotine, Flavoring  Substance Use Topics   Alcohol use: Yes   Drug use: Never     Allergies   Patient has no known allergies.   Review of Systems Review of Systems See HPI  Physical Exam Triage Vital Signs ED Triage Vitals  Enc Vitals Group     BP 06/27/21 1914 111/80     Pulse Rate 06/27/21 1914 81     Resp 06/27/21 1914 14     Temp 06/27/21 1914 98.4 F (36.9 C)     Temp Source 06/27/21 1914 Oral     SpO2 06/27/21 1914 98 %     Weight 06/27/21 1916 160 lb (72.6 kg)     Height --      Head Circumference --  Peak Flow --      Pain Score 06/27/21 1916 0     Pain Loc --      Pain Edu? --      Excl. in GC? --    No data found.  Updated Vital Signs BP 111/80 (BP Location: Right Arm)    Pulse 81    Temp 98.4 F (36.9 C) (Oral)    Resp 14    Wt 72.6 kg    SpO2 98%    BMI 25.06 kg/m      Physical Exam Constitutional:      General: She is not in acute distress.    Appearance: She is well-developed.  HENT:     Head: Normocephalic and atraumatic.     Right Ear: Tympanic membrane normal.     Left Ear: Tympanic membrane normal.     Nose: No congestion or rhinorrhea.     Mouth/Throat:     Mouth: Mucous membranes are moist.     Pharynx: Posterior oropharyngeal erythema present.     Comments: Mild erythema posterior pharynx Eyes:     Conjunctiva/sclera: Conjunctivae normal.     Pupils: Pupils are equal, round, and reactive to light.  Cardiovascular:     Rate and Rhythm: Normal rate and regular rhythm.     Heart sounds: Normal heart sounds.  Pulmonary:     Effort: Pulmonary effort is normal. No respiratory distress.     Breath sounds: Wheezing and rhonchi present. No rales.     Comments: Few scattered central rhonchi and  wheeze Abdominal:     General: There is no distension.     Palpations: Abdomen is soft.  Musculoskeletal:        General: Normal range of motion.     Cervical back: Normal range of motion.  Skin:    General: Skin is warm and dry.  Neurological:     Mental Status: She is alert.  Psychiatric:        Mood and Affect: Mood normal.        Behavior: Behavior normal.     UC Treatments / Results  Labs (all labs ordered are listed, but only abnormal results are displayed) Labs Reviewed - No data to display  EKG   Radiology No results found.  Procedures Procedures (including critical care time)  Medications Ordered in UC Medications - No data to display  Initial Impression / Assessment and Plan / UC Course  I have reviewed the triage vital signs and the nursing notes.  Pertinent labs & imaging results that were available during my care of the patient were reviewed by me and considered in my medical decision making (see chart for details).     Discussed viral infection.  Antibiotics if she fails to improve Final Clinical Impressions(s) / UC Diagnoses   Final diagnoses:  Bronchitis     Discharge Instructions      Take the prednisone 40 mg a day for 5 days.  Take 2 pills today Drink lots of water Run a humidifier in your room if you have 1 Take a cough medicine such as Mucinex DM or Delsym every 12 hours If you fail to improve in the next couple of days fill and take the Z-Pak    ED Prescriptions     Medication Sig Dispense Auth. Provider   predniSONE (DELTASONE) 20 MG tablet Take 1 tablet (20 mg total) by mouth 2 (two) times daily with a meal. 10 tablet Eustace Moore, MD  azithromycin (ZITHROMAX Z-PAK) 250 MG tablet Take two pills today followed by one a day until gone 6 tablet Delton See Letta Pate, MD      PDMP not reviewed this encounter.   Eustace Moore, MD 06/27/21 [phone]

## 2021-07-02 ENCOUNTER — Telehealth: Payer: Medicaid Other | Admitting: Nurse Practitioner

## 2021-07-02 DIAGNOSIS — B3731 Acute candidiasis of vulva and vagina: Secondary | ICD-10-CM | POA: Diagnosis not present

## 2021-07-02 MED ORDER — FLUCONAZOLE 150 MG PO TABS: 150.0000 mg | ORAL_TABLET | Freq: Once | ORAL | 0 refills | Status: AC

## 2021-07-02 NOTE — Progress Notes (Signed)

## 2021-07-02 NOTE — Addendum Note (Signed)
Addended by: Bennie Pierini on: 07/02/2021 04:20 PM   Modules accepted: Orders

## 2021-07-04 ENCOUNTER — Other Ambulatory Visit (HOSPITAL_COMMUNITY)
Admission: RE | Admit: 2021-07-04 | Discharge: 2021-07-04 | Disposition: A | Discharge status: Home / Self Care | Payer: Medicaid Other | Source: Ambulatory Visit | Attending: Family Medicine | Admitting: Family Medicine

## 2021-07-04 ENCOUNTER — Other Ambulatory Visit: Payer: Self-pay

## 2021-07-04 ENCOUNTER — Emergency Department (INDEPENDENT_AMBULATORY_CARE_PROVIDER_SITE_OTHER)
Admission: RE | Admit: 2021-07-04 | Discharge: 2021-07-04 | Disposition: A | Discharge status: Home / Self Care | Payer: Medicaid Other | Source: Ambulatory Visit

## 2021-07-04 VITALS — BP 115/82 | HR 92 | Temp 98.4°F | Resp 20 | Ht 68.0 in | Wt 160.0 lb

## 2021-07-04 DIAGNOSIS — B3731 Acute candidiasis of vulva and vagina: Secondary | ICD-10-CM | POA: Diagnosis not present

## 2021-07-04 DIAGNOSIS — R309 Painful micturition, unspecified: Secondary | ICD-10-CM | POA: Diagnosis not present

## 2021-07-04 DIAGNOSIS — N898 Other specified noninflammatory disorders of vagina: Secondary | ICD-10-CM | POA: Insufficient documentation

## 2021-07-04 DIAGNOSIS — N3001 Acute cystitis with hematuria: Secondary | ICD-10-CM | POA: Insufficient documentation

## 2021-07-04 LAB — POCT URINALYSIS DIP (MANUAL ENTRY)
Bilirubin, UA: NEGATIVE
Glucose, UA: NEGATIVE mg/dL
Ketones, POC UA: NEGATIVE mg/dL
Nitrite, UA: NEGATIVE
Protein Ur, POC: NEGATIVE mg/dL
Spec Grav, UA: 1.015 (ref 1.010–1.025)
Urobilinogen, UA: 0.2 E.U./dL
pH, UA: 6 (ref 5.0–8.0)

## 2021-07-04 MED ORDER — FLUCONAZOLE 150 MG PO TABS: ORAL_TABLET | ORAL | 0 refills | Status: DC

## 2021-07-04 MED ORDER — SULFAMETHOXAZOLE-TRIMETHOPRIM 800-160 MG PO TABS
1.0000 | ORAL_TABLET | Freq: Two times a day (BID) | ORAL | 0 refills | Status: AC
Start: 2021-07-04 — End: 2021-07-11

## 2021-07-04 NOTE — ED Triage Notes (Signed)
Pt states that she has some vaginal itching, vaginal discharge and vaginal irritation. X4 days  Pt states that she recently had e-visit and they gave her diflucan. Pt states that did help a little bit but she is still having symptoms.   Pt states that she has some burning with urination as well.

## 2021-07-04 NOTE — Discharge Instructions (Addendum)
Advised patient to take medication as directed with food to completion.  Encourage patient to increase daily water intake while taking this medication.  Advised patient we will follow-up with with her once urine culture and Aptima swab results are returned.

## 2021-07-04 NOTE — ED Provider Notes (Signed)
[facility]    CSN: [id number] Arrival date & time: 07/04/21  1137      History   Chief Complaint Chief Complaint  Patient presents with   Vaginal Itching    Vaginal itching, vaginal discharge, and vaginal irritation. X4 days    HPI Candace Ford is a 31 y.o. female.   HPI 31 year old female presents with vaginal itching, vaginal discharge and vaginal irritation for 4 days.  Reports recent E-visit where she was given Diflucan.  Patient reports symptoms still persist and has some burning with urination as well.  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 Authorizing Provider  fluconazole (DIFLUCAN) 150 MG tablet Take 1 tab p.o. for vaginal candidiasis, may repeat 1 tab p.o. in 3 days if symptoms are not resolved. 07/04/21  Yes Eliezer Lofts, FNP  sulfamethoxazole-trimethoprim (BACTRIM DS) 800-160 MG tablet Take 1 tablet by mouth 2 (two) times daily for 7 days. 07/04/21 07/11/21 Yes Eliezer Lofts, FNP  azithromycin (ZITHROMAX Z-PAK) 250 MG tablet Take two pills today followed by one a day until gone 06/27/21   Raylene Everts, MD  predniSONE (DELTASONE) 20 MG tablet Take 1 tablet (20 mg total) by mouth 2 (two) times  daily with a meal. 06/27/21   Raylene Everts, MD    Family History Family History  Problem Relation Age of Onset   Epilepsy Mother    Heart attack Father    Diabetes Maternal Grandmother    Healthy Sister    Healthy Brother     Social History Social History   Tobacco Use   Smoking status: Former    Packs/day: 0.25    Types: Cigarettes   Smokeless tobacco: Never  Vaping Use   Vaping Use: Every day   Substances: Nicotine, Flavoring  Substance Use Topics   Alcohol use: Yes   Drug use: Never     Allergies   Patient has no known allergies.   Review of Systems Review of Systems  Genitourinary:  Positive for dysuria and vaginal discharge.       Vaginal itching and vaginal irritation x4 days    Physical Exam Triage Vital Signs ED Triage Vitals  Enc Vitals Group     BP 07/04/21 1223 115/82     Pulse Rate 07/04/21 1223 92     Resp 07/04/21 1223 20     Temp 07/04/21 1223 98.4 F (36.9 C)     Temp  Source 07/04/21 1223 Oral     SpO2 07/04/21 1223 95 %     Weight 07/04/21 1221 160 lb (72.6 kg)     Height 07/04/21 1221 5\' 8"  (1.727 m)     Head Circumference --      Peak Flow --      Pain Score 07/04/21 1221 5     Pain Loc --      Pain Edu? --      Excl. in [place]? --    No data found.  Updated Vital Signs BP 115/82 (BP Location: Right Arm)    Pulse 92    Temp 98.4 F (36.9 C) (Oral)    Resp 20    Ht 5\' 8"  (1.727 m)    Wt 160 lb (72.6 kg)    LMP 06/03/2021    SpO2 95%    BMI 24.33 kg/m   Physical Exam Vitals and nursing note reviewed.  Constitutional:      Appearance: Normal appearance. She is normal weight.  HENT:     Head: Normocephalic and atraumatic.     Mouth/Throat:     Mouth: Mucous membranes are moist.     Pharynx: Oropharynx is clear.  Eyes:     Extraocular Movements: Extraocular movements intact.     Conjunctiva/sclera: Conjunctivae normal.     Pupils: Pupils are equal, round, and reactive to light.  Cardiovascular:     Rate and Rhythm: Normal  rate and regular rhythm.     Pulses: Normal pulses.     Heart sounds: Normal heart sounds.  Pulmonary:     Effort: Pulmonary effort is normal.     Breath sounds: Normal breath sounds.  Abdominal:     Tenderness: There is no right CVA tenderness or left CVA tenderness.  Musculoskeletal:     Cervical back: Normal range of motion and neck supple.  Skin:    General: Skin is warm and dry.  Neurological:     General: No focal deficit present.     Mental Status: She is alert and oriented to person, place, and time.     UC Treatments / Results  Labs (all labs ordered are listed, but only abnormal results are displayed) Labs Reviewed  POCT URINALYSIS DIP (MANUAL ENTRY) - Abnormal; Notable for the following components:      Result Value   Blood, UA trace-intact (*)    Leukocytes, UA Large (3+) (*)    All other components within normal limits  URINE CULTURE  CERVICOVAGINAL ANCILLARY ONLY    EKG   Radiology No results found.  Procedures Procedures (including critical care time)  Medications Ordered in UC Medications - No data to display  Initial Impression / Assessment and Plan / UC Course  I have reviewed the triage vital signs and the nursing notes.  Pertinent labs & imaging results that were available during my care of the patient were reviewed by me and considered in my medical decision making (see chart for details).     MDM: 1.  Acute cystitis with hematuria-Rx'd Bactrim; 2.  Vaginal itching-Rx'd Diflucan; 3. Vaginal discharge-Aptima swab ordered. Advised patient to take medication as directed with food to completion.  Encourage patient to increase daily water intake while taking this medication.  Advised patient we will follow-up with with her once urine culture and Aptima swab results are returned.  Patient discharged home, hemodynamically stable. Final Clinical Impressions(s) / UC Diagnoses   Final diagnoses:  Pain with urination  Acute cystitis with hematuria  Vaginal candidiasis  Vaginal discharge     Discharge Instructions      Advised patient to take medication as directed with food to completion.  Encourage patient to increase daily water intake while taking this medication.  Advised patient we will follow-up with with her once urine culture and Aptima swab results are returned.     ED Prescriptions     Medication Sig Dispense Auth. Provider   sulfamethoxazole-trimethoprim (BACTRIM DS) 800-160 MG tablet Take 1 tablet by mouth 2 (two) times daily for 7 days. 14 tablet Eliezer Lofts, FNP   fluconazole (DIFLUCAN) 150 MG tablet Take 1 tab p.o. for vaginal candidiasis, may repeat 1 tab p.o. in 3 days if symptoms are not resolved. 4 tablet Eliezer Lofts, FNP      PDMP not reviewed this encounter.   Eliezer Lofts, FNP 07/04/21 1410

## 2021-07-05 ENCOUNTER — Telehealth: Payer: Self-pay

## 2021-07-05 LAB — CERVICOVAGINAL ANCILLARY ONLY
Bacterial Vaginitis (gardnerella): POSITIVE — AB
Candida Glabrata: NEGATIVE
Candida Vaginitis: NEGATIVE
Chlamydia: POSITIVE — AB
Comment: NEGATIVE
Comment: NEGATIVE
Comment: NEGATIVE
Comment: NEGATIVE
Comment: NORMAL
Neisseria Gonorrhea: NEGATIVE

## 2021-07-05 LAB — URINE CULTURE
MICRO NUMBER:: 13030992
SPECIMEN QUALITY:: ADEQUATE

## 2021-07-05 MED ORDER — METRONIDAZOLE 500 MG PO TABS: 500.0000 mg | ORAL_TABLET | Freq: Two times a day (BID) | ORAL | 0 refills | Status: DC

## 2021-07-05 MED ORDER — DOXYCYCLINE HYCLATE 100 MG PO CAPS: 100.0000 mg | ORAL_CAPSULE | Freq: Two times a day (BID) | ORAL | 0 refills | Status: AC

## 2021-07-05 NOTE — Telephone Encounter (Signed)
Pt called re lab results. Provider on staff notified. Scripts being sent to pts pharmacy - [facility]. Pt notified.

## 2021-07-05 NOTE — Telephone Encounter (Signed)
Patient called requesting medications for treatment of chlamydia and bacterial vaginosis be sent to pharmacy.  Doxycycline and metronidazole sent to pharmacy.  Patient reports no possibility of pregnancy.

## 2021-07-05 NOTE — Telephone Encounter (Signed)
Per provider, call pt to be sure not pregnant. Per pt, no chance she is pregnant. Provider notified.

## 2021-07-06 ENCOUNTER — Telehealth (HOSPITAL_COMMUNITY): Payer: Self-pay | Admitting: Emergency Medicine

## 2021-07-06 MED ORDER — METRONIDAZOLE 0.75 % VA GEL: 1.0000 | Freq: Every day | VAGINAL | 0 refills | Status: AC

## 2021-07-25 ENCOUNTER — Other Ambulatory Visit: Payer: Self-pay

## 2021-07-25 ENCOUNTER — Encounter: Payer: Self-pay | Admitting: Family Medicine

## 2021-07-25 ENCOUNTER — Other Ambulatory Visit (HOSPITAL_COMMUNITY)
Admission: RE | Admit: 2021-07-25 | Discharge: 2021-07-25 | Disposition: A | Discharge status: Home / Self Care | Payer: Medicaid Other | Source: Ambulatory Visit | Attending: Family Medicine | Admitting: Family Medicine

## 2021-07-25 ENCOUNTER — Ambulatory Visit: Payer: Medicaid Other | Admitting: Family Medicine

## 2021-07-25 VITALS — BP 117/80 | HR 90 | Ht 68.0 in | Wt 159.0 lb

## 2021-07-25 DIAGNOSIS — Z124 Encounter for screening for malignant neoplasm of cervix: Secondary | ICD-10-CM | POA: Insufficient documentation

## 2021-07-25 DIAGNOSIS — Z01419 Encounter for gynecological examination (general) (routine) without abnormal findings: Secondary | ICD-10-CM | POA: Diagnosis not present

## 2021-07-25 DIAGNOSIS — Z113 Encounter for screening for infections with a predominantly sexual mode of transmission: Secondary | ICD-10-CM

## 2021-07-25 DIAGNOSIS — L299 Pruritus, unspecified: Secondary | ICD-10-CM | POA: Diagnosis not present

## 2021-07-25 NOTE — Patient Instructions (Signed)
Preventive Care 29-29 Years Old, Female ?Preventive care refers to lifestyle choices and visits with your health care provider that can promote health and wellness. Preventive care visits are also called wellness exams. ?What can I expect for my preventive care visit? ?Counseling ?During your preventive care visit, your health care provider may ask about your: ?Medical history, including: ?Past medical problems. ?Family medical history. ?Pregnancy history. ?Current health, including: ?Menstrual cycle. ?Method of birth control. ?Emotional well-being. ?Home life and relationship well-being. ?Sexual activity and sexual health. ?Lifestyle, including: ?Alcohol, nicotine or tobacco, and drug use. ?Access to firearms. ?Diet, exercise, and sleep habits. ?Work and work Statistician. ?Sunscreen use. ?Safety issues such as seatbelt and bike helmet use. ?Physical exam ?Your health care provider may check your: ?Height and weight. These may be used to calculate your BMI (body mass index). BMI is a measurement that tells if you are at a healthy weight. ?Waist circumference. This measures the distance around your waistline. This measurement also tells if you are at a healthy weight and may help predict your risk of certain diseases, such as type 2 diabetes and high blood pressure. ?Heart rate and blood pressure. ?Body temperature. ?Skin for abnormal spots. ?What immunizations do I need? ?Vaccines are usually given at various ages, according to a schedule. Your health care provider will recommend vaccines for you based on your age, medical history, and lifestyle or other factors, such as travel or where you work. ?What tests do I need? ?Screening ?Your health care provider may recommend screening tests for certain conditions. This may include: ?Pelvic exam and Pap test. ?Lipid and cholesterol levels. ?Diabetes screening. This is done by checking your blood sugar (glucose) after you have not eaten for a while (fasting). ?Hepatitis B  test. ?Hepatitis C test. ?HIV (human immunodeficiency virus) test. ?STI (sexually transmitted infection) testing, if you are at risk. ?BRCA-related cancer screening. This may be done if you have a family history of breast, ovarian, tubal, or peritoneal cancers. ?Talk with your health care provider about your test results, treatment options, and if necessary, the need for more tests. ?Follow these instructions at home: ?Eating and drinking ? ?Eat a healthy diet that includes fresh fruits and vegetables, whole grains, lean protein, and low-fat dairy products. ?Take vitamin and mineral supplements as recommended by your health care provider. ?Do not drink alcohol if: ?Your health care provider tells you not to drink. ?You are pregnant, may be pregnant, or are planning to become pregnant. ?If you drink alcohol: ?Limit how much you have to 0-1 drink a day. ?Know how much alcohol is in your drink. In the [place] one drink equals one 12 oz bottle of beer (355 mL), one 5 oz glass of wine (148 mL), or one 1? oz glass of hard liquor (44 mL). ?Lifestyle ?Brush your teeth every morning and night with fluoride toothpaste. Floss one time each day. ?Exercise for at least 30 minutes 5 or more days each week. ?Do not use any products that contain nicotine or tobacco. These products include cigarettes, chewing tobacco, and vaping devices, such as e-cigarettes. If you need help quitting, ask your health care provider. ?Do not use drugs. ?If you are sexually active, practice safe sex. Use a condom or other form of protection to prevent STIs. ?If you do not wish to become pregnant, use a form of birth control. If you plan to become pregnant, see your health care provider for a prepregnancy visit. ?Find healthy ways to manage stress, such as: ?Meditation, yoga,  or listening to music. ?Journaling. ?Talking to a trusted person. ?Spending time with friends and family. ?Minimize exposure to UV radiation to reduce your risk of skin  cancer. ?Safety ?Always wear your seat belt while driving or riding in a vehicle. ?Do not drive: ?If you have been drinking alcohol. Do not ride with someone who has been drinking. ?If you have been using any mind-altering substances or drugs. ?While texting. ?When you are tired or distracted. ?Wear a helmet and other protective equipment during sports activities. ?If you have firearms in your house, make sure you follow all gun safety procedures. ?Seek help if you have been physically or sexually abused. ?What's next? ?Go to your health care provider once a year for an annual wellness visit. ?Ask your health care provider how often you should have your eyes and teeth checked. ?Stay up to date on all vaccines. ?This information is not intended to replace advice given to you by your health care provider. Make sure you discuss any questions you have with your health care provider. ?Document Revised: 10/27/2020 Document Reviewed: 10/27/2020 ?Elsevier Patient Education ? [place]. ? ?

## 2021-07-25 NOTE — Progress Notes (Signed)
Subjective:  ?  ? Candace Ford is a 31 y.o. female and is here for a comprehensive physical exam. The patient reports problems - notes vaginal discharge and pain recently . Has had recurrent BV.  ?Wants to know if cholestasis goes away. Has had chlamydia x 2 recently. ? ? ? ?The following portions of the patient's history were reviewed and updated as appropriate: allergies, current medications, past family history, past medical history, past social history, past surgical history, and problem list. ? ?Review of Systems ?Pertinent items noted in HPI and remainder of comprehensive ROS otherwise negative.  ? ?Objective:  ? ? BP 117/80   Pulse 90   Ht 5\' 8"  (1.727 m)   Wt 159 lb (72.1 kg)   LMP 07/11/2021   BMI 24.18 kg/m?  ?General appearance: alert, cooperative, and appears stated age ?Head: Normocephalic, without obvious abnormality, atraumatic ?Neck: no adenopathy, supple, symmetrical, trachea midline, and thyroid not enlarged, symmetric, no tenderness/mass/nodules ?Lungs: clear to auscultation bilaterally ?Breasts: normal appearance, no masses or tenderness ?Heart: regular rate and rhythm, S1, S2 normal, no murmur, click, rub or gallop ?Abdomen: soft, non-tender; bowel sounds normal; no masses,  no organomegaly ?Pelvic: cervix normal in appearance, external genitalia normal, no adnexal masses or tenderness, no cervical motion tenderness, uterus normal size, shape, and consistency, and vagina normal without discharge ?Extremities: extremities normal, atraumatic, no cyanosis or edema ?Pulses: 2+ and symmetric ?Skin: Skin color, texture, turgor normal. No rashes or lesions ?Lymph nodes: Cervical, supraclavicular, and axillary nodes normal. ?Neurologic: Grossly normal  ?  ?Assessment:  ? ? Healthy female exam.    ?  ?Plan:  ?Screening for malignant neoplasm of cervix - Plan: Cytology - PAP( [place]) ? ?Encounter for gynecological examination without abnormal finding ? ?Screen for STD (sexually transmitted  disease) - recheck in 3 months for GC/Chlam - Plan: RPR, Hepatitis C antibody, Hepatitis B surface antigen, HIV Antibody (routine testing w rflx) ? 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 - Plan: Comprehensive metabolic panel ? ?Return in 1 year (on 07/26/2022). ? ?  ?See After Visit Summary for Counseling Recommendations  ? ?

## 2021-07-26 LAB — COMPREHENSIVE METABOLIC PANEL
AG Ratio: 1.5 (calc) (ref 1.0–2.5)
ALT: 10 U/L (ref 6–29)
AST: 14 U/L (ref 10–30)
Albumin: 4.2 g/dL (ref 3.6–5.1)
Alkaline phosphatase (APISO): 67 U/L (ref 31–125)
BUN: 8 mg/dL (ref 7–25)
CO2: 29 mmol/L (ref 20–32)
Calcium: 9.3 mg/dL (ref 8.6–10.2)
Chloride: 105 mmol/L (ref 98–110)
Creat: 0.67 mg/dL (ref 0.50–0.96)
Globulin: 2.8 g/dL (calc) (ref 1.9–3.7)
Glucose, Bld: 103 mg/dL (ref 65–139)
Potassium: 4.6 mmol/L (ref 3.5–5.3)
Sodium: 140 mmol/L (ref 135–146)
Total Bilirubin: 0.4 mg/dL (ref 0.2–1.2)
Total Protein: 7 g/dL (ref 6.1–8.1)

## 2021-07-26 LAB — HEPATITIS B SURFACE ANTIGEN: Hepatitis B Surface Ag: NONREACTIVE

## 2021-07-26 LAB — CYTOLOGY - PAP
Adequacy: ABSENT
Diagnosis: NEGATIVE

## 2021-07-26 LAB — RPR: RPR Ser Ql: NONREACTIVE

## 2021-07-26 LAB — HIV ANTIBODY (ROUTINE TESTING W REFLEX): HIV 1&2 Ab, 4th Generation: NONREACTIVE

## 2021-07-26 LAB — HEPATITIS C ANTIBODY
Hepatitis C Ab: NONREACTIVE
SIGNAL TO CUT-OFF: 0.02 (ref <1.00)

## 2021-08-27 ENCOUNTER — Emergency Department
Admission: RE | Admit: 2021-08-27 | Discharge: 2021-08-27 | Disposition: A | Discharge status: Home / Self Care | Payer: Medicaid Other | Source: Ambulatory Visit | Attending: Family Medicine | Admitting: Family Medicine

## 2021-08-27 VITALS — BP 116/79 | HR 101 | Temp 98.3°F | Resp 18 | Ht 67.0 in | Wt 155.0 lb

## 2021-08-27 DIAGNOSIS — J020 Streptococcal pharyngitis: Secondary | ICD-10-CM

## 2021-08-27 LAB — POCT RAPID STREP A (OFFICE): Rapid Strep A Screen: POSITIVE — AB

## 2021-08-27 MED ORDER — PENICILLIN V POTASSIUM 500 MG PO TABS: 500.0000 mg | ORAL_TABLET | Freq: Two times a day (BID) | ORAL | 0 refills | Status: AC

## 2021-08-27 NOTE — ED Triage Notes (Signed)
Patient c/o possible strep throat, sore throat x 1 day, some headache.  Patient has taken Ibuprofen, Tylenol and Zyrtec. ?

## 2021-08-27 NOTE — Discharge Instructions (Addendum)
Take your penicillin 2 times a day for 10 full days,  take 2 doses today ?Get Chloraseptic spray or lozenges for the throat pain.  (Or generic) ?May take Tylenol or ibuprofen as needed for pain ?Drink lots of fluids ?Call for problems ?

## 2021-08-27 NOTE — ED Provider Notes (Signed)
?[facility] ? ? ? ?CSN: [id number] ?Arrival date & time: 08/27/21  1345 ? ? ?  ? ?History   ?Chief Complaint ?Chief Complaint  ?Patient presents with  ? Sore Throat  ?  Entered by patient  ? ? ?HPI ?Candace Ford is a 31 y.o. female.  ? ?HPI ? ?Patient is here for sore throat.  She states has been severely painful just since last night.  No fever or chills.  No headache or body aches.  No known exposure to illness ? ?Past Medical History:  ?Diagnosis Date  ? Cholestasis during pregnancy   ? COVID 06/2020  ? Medical history non-contributory   ? ? ?Patient Active Problem List  ? Diagnosis Date Noted  ? History of gestational diabetes 11/23/2017  ? 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 ? ?Past Surgical History:  ?Procedure Laterality Date  ? NO PAST SURGERIES    ? ? ?OB History   ? ? Gravida  ?4  ? Para  ?3  ? Term  ?3  ? Preterm  ?   ? AB  ?1  ? Living  ?3  ?  ? ? SAB  ?1  ? IAB  ?   ? Ectopic  ?   ? Multiple  ?   ? Live Births  ?1  ?   ?  ?  ? ? ? ?Home Medications   ? ?Prior to Admission medications   ?Medication Sig Start Date End Date Taking? Authorizing Provider  ?penicillin v potassium (VEETID) 500 MG tablet Take 1 tablet (500 mg total) by mouth 2 (two) times daily for 10 days. 08/27/21 09/06/21 Yes Eustace Moore, MD  ? ? ?Family History ?Family History  ?Problem Relation Age of Onset  ? Epilepsy Mother   ? Hypertension Father   ? Heart disease Father 48  ?     heart attack  ? Heart attack Father   ? Healthy Sister   ? Healthy Brother   ? Diabetes Maternal Grandmother   ? Cancer Paternal Grandfather   ?     pancreatic  ? ? ?Social History ?Social History  ? ?Tobacco Use  ? Smoking status: Former  ?  Packs/day: 0.25  ?  Types: Cigarettes  ? Smokeless tobacco: Never  ?Vaping Use  ? Vaping Use: Every day  ? Substances: Nicotine, Flavoring  ?Substance Use Topics  ? Alcohol use: Yes  ?  Alcohol/week: 2.0 standard drinks  ?  Types: 2 Standard drinks or equivalent per week  ? Drug use: Never   ? ? ? ?Allergies   ?Patient has no known allergies. ? ? ?Review of Systems ?Review of Systems ?See HPI ? ?Physical Exam ?Triage Vital Signs ?ED Triage Vitals [08/27/21 1429]  ?Enc Vitals Group  ?   BP 116/79  ?   Pulse Rate (!) 101  ?   Resp 18  ?   Temp 98.3 ?F (36.8 ?C)  ?   Temp Source Oral  ?   SpO2 100 %  ?   Weight 155 lb (70.3 kg)  ?   Height 5\' 7"  (1.702 m)  ?   Head Circumference   ?   Peak Flow   ?   Pain Score 6  ?   Pain Loc   ?   Pain Edu?   ?   Excl. in [place]?   ? ?No data found. ? ?Updated Vital Signs ?BP 116/79 (BP Location: Right Arm)   Pulse (!) 101  Temp 98.3 ?F (36.8 ?C) (Oral)   Resp 18   Ht 5\' 7"  (1.702 m)   Wt 70.3 kg   LMP 08/08/2021   SpO2 100%   BMI 24.28 kg/m?  ?   ? ?Physical Exam ?Constitutional:   ?   General: She is not in acute distress. ?   Appearance: She is well-developed and normal weight.  ?HENT:  ?   Head: Normocephalic and atraumatic.  ?   Right Ear: Tympanic membrane and ear canal normal.  ?   Left Ear: Tympanic membrane and ear canal normal.  ?   Nose: No congestion or rhinorrhea.  ?   Mouth/Throat:  ?   Pharynx: Uvula midline. Pharyngeal swelling, posterior oropharyngeal erythema and uvula swelling present.  ?   Tonsils: No tonsillar exudate. 1+ on the right. 1+ on the left.  ?Eyes:  ?   Conjunctiva/sclera: Conjunctivae normal.  ?   Pupils: Pupils are equal, round, and reactive to light.  ?Cardiovascular:  ?   Rate and Rhythm: Normal rate.  ?Pulmonary:  ?   Effort: Pulmonary effort is normal. No respiratory distress.  ?Abdominal:  ?   General: There is no distension.  ?   Palpations: Abdomen is soft.  ?Musculoskeletal:     ?   General: Normal range of motion.  ?   Cervical back: Normal range of motion.  ?Lymphadenopathy:  ?   Cervical: Cervical adenopathy present.  ?Skin: ?   General: Skin is warm and dry.  ?Neurological:  ?   General: No focal deficit present.  ?   Mental Status: She is alert.  ? ? ? ?UC Treatments / Results  ?Labs ?(all labs ordered are listed, but  only abnormal results are displayed) ?Labs Reviewed  ?POCT RAPID STREP A (OFFICE) - Abnormal; Notable for the following components:  ?    Result Value  ? Rapid Strep A Screen Positive (*)   ? All other components within normal limits  ? ? ?EKG ? ? ?Radiology ?No results found. ? ?Procedures ?Procedures (including critical care time) ? ?Medications Ordered in UC ?Medications - No data to display ? ?Initial Impression / Assessment and Plan / UC Course  ?I have reviewed the triage vital signs and the nursing notes. ? ?Pertinent labs & imaging results that were available during my care of the patient were reviewed by me and considered in my medical decision making (see chart for details). ? ?  ? ?Final Clinical Impressions(s) / UC Diagnoses  ? ?Final diagnoses:  ?Strep throat  ? ? ? ?Discharge Instructions   ? ?  ?Take your penicillin 2 times a day for 10 full days,  take 2 doses today ?Get Chloraseptic spray or lozenges for the throat pain.  (Or generic) ?May take Tylenol or ibuprofen as needed for pain ?Drink lots of fluids ?Call for problems ? ? ?ED Prescriptions   ? ? Medication Sig Dispense Auth. Provider  ? penicillin v potassium (VEETID) 500 MG tablet Take 1 tablet (500 mg total) by mouth 2 (two) times daily for 10 days. 20 tablet 08/10/2021, MD  ? ?  ? ?PDMP not reviewed this encounter. ?  ?Eustace Moore, MD ?08/27/21 1651 ? ?

## 2021-08-28 ENCOUNTER — Ambulatory Visit: Payer: Medicaid Other

## 2021-09-01 ENCOUNTER — Telehealth: Payer: Medicaid Other | Admitting: Physician Assistant

## 2021-09-01 DIAGNOSIS — B379 Candidiasis, unspecified: Secondary | ICD-10-CM | POA: Diagnosis not present

## 2021-09-01 DIAGNOSIS — T3695XA Adverse effect of unspecified systemic antibiotic, initial encounter: Secondary | ICD-10-CM

## 2021-09-01 MED ORDER — FLUCONAZOLE 150 MG PO TABS: 150.0000 mg | ORAL_TABLET | Freq: Once | ORAL | 0 refills | Status: AC

## 2021-09-01 NOTE — Progress Notes (Signed)

## 2021-09-01 NOTE — Progress Notes (Signed)
I have spent 5 minutes in review of e-visit questionnaire, review and updating patient chart, medical decision making and response to patient.   Daymen Hassebrock Cody Geovani Tootle, PA-C    

## 2021-10-03 ENCOUNTER — Ambulatory Visit: Payer: Medicaid Other | Admitting: Medical-Surgical

## 2021-12-21 ENCOUNTER — Telehealth: Payer: Medicaid Other | Admitting: Physician Assistant

## 2021-12-21 DIAGNOSIS — B3731 Acute candidiasis of vulva and vagina: Secondary | ICD-10-CM

## 2021-12-21 MED ORDER — FLUCONAZOLE 200 MG PO TABS: 200.0000 mg | ORAL_TABLET | Freq: Once | ORAL | 0 refills | Status: AC

## 2021-12-21 NOTE — Progress Notes (Signed)

## 2021-12-30 ENCOUNTER — Ambulatory Visit: Payer: Medicaid Other | Admitting: Obstetrics and Gynecology

## 2022-01-04 ENCOUNTER — Telehealth: Payer: Medicaid Other | Admitting: Physician Assistant

## 2022-01-04 DIAGNOSIS — J020 Streptococcal pharyngitis: Secondary | ICD-10-CM | POA: Diagnosis not present

## 2022-01-04 MED ORDER — AMOXICILLIN 500 MG PO CAPS: 500.0000 mg | ORAL_CAPSULE | Freq: Two times a day (BID) | ORAL | 0 refills | Status: DC

## 2022-01-04 NOTE — Progress Notes (Signed)

## 2022-01-05 MED ORDER — AMOXICILLIN 500 MG PO CAPS: 500.0000 mg | ORAL_CAPSULE | Freq: Two times a day (BID) | ORAL | 0 refills | Status: AC

## 2022-01-05 NOTE — Addendum Note (Signed)
Addended by: Waldon Merl on: 01/05/2022 04:27 PM   Modules accepted: Orders

## 2022-02-05 ENCOUNTER — Telehealth: Payer: Medicaid Other | Admitting: Family

## 2022-02-05 DIAGNOSIS — B3731 Acute candidiasis of vulva and vagina: Secondary | ICD-10-CM | POA: Diagnosis not present

## 2022-02-05 MED ORDER — FLUCONAZOLE 150 MG PO TABS: 150.0000 mg | ORAL_TABLET | ORAL | 0 refills | Status: DC | PRN

## 2022-02-05 NOTE — Progress Notes (Signed)

## 2022-02-06 ENCOUNTER — Telehealth: Payer: Medicaid Other | Admitting: Physician Assistant

## 2022-02-06 DIAGNOSIS — N898 Other specified noninflammatory disorders of vagina: Secondary | ICD-10-CM

## 2022-02-06 NOTE — Progress Notes (Signed)
Duplicate

## 2022-06-30 IMAGING — US US ABDOMEN COMPLETE
1 series · 14 of 25 positions shown · non-contrast
Comparison: None.

CLINICAL DATA: Right upper quadrant pain

EXAM:
ABDOMEN ULTRASOUND COMPLETE

[Series 1: us abdomen complete · 0.09mm/px · 14 of 111 slices shown]
[im 1/111]
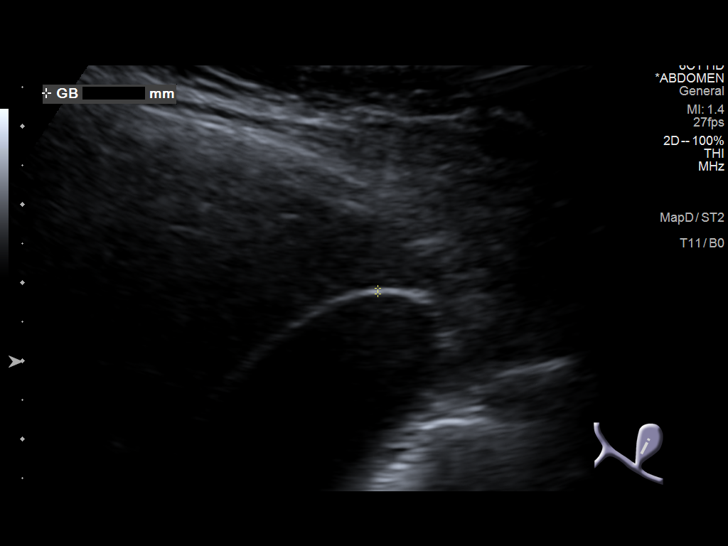
[im 10/111]
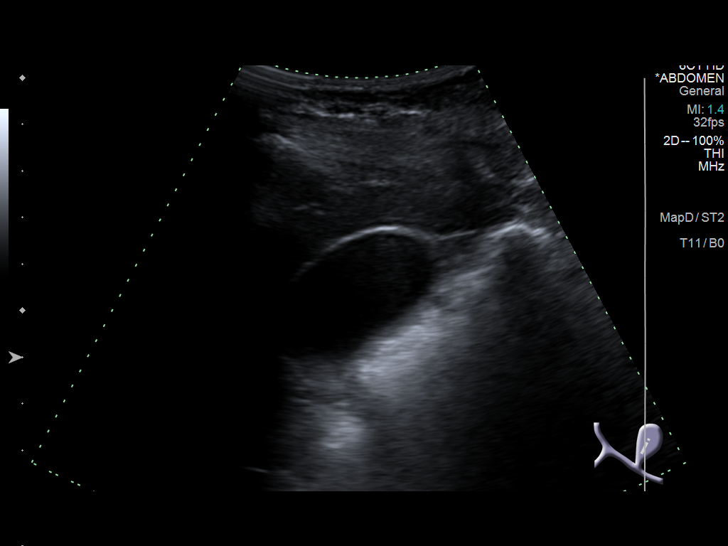
[im 19/111]
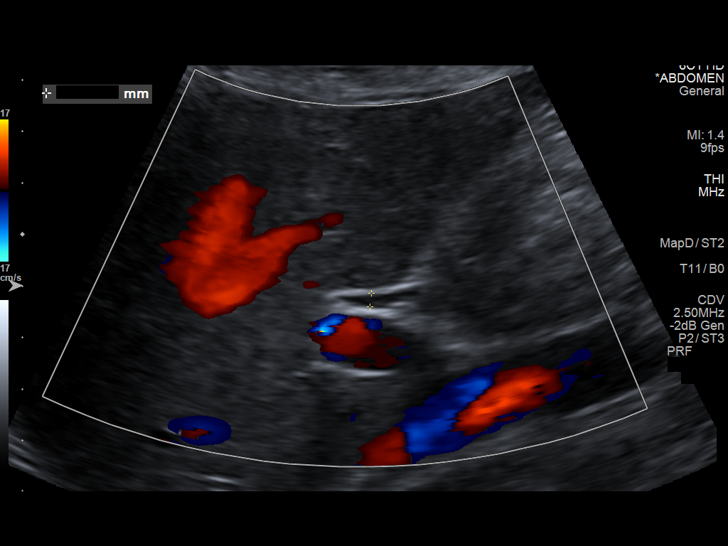
[im 28/111]
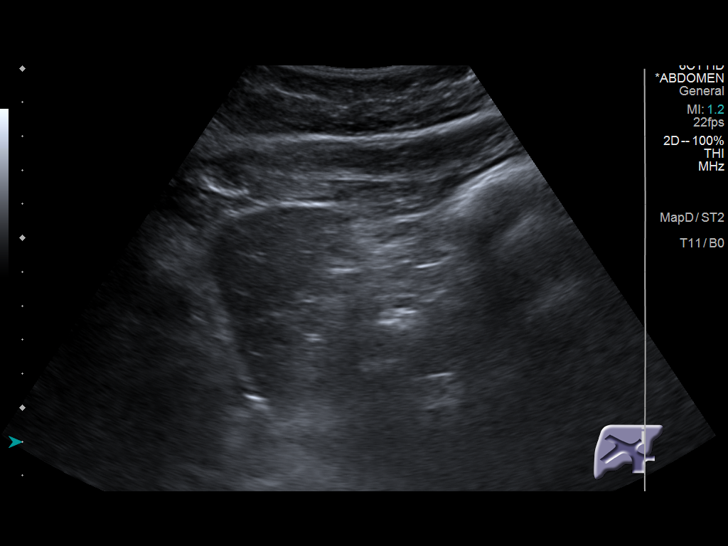
[im 37/111]
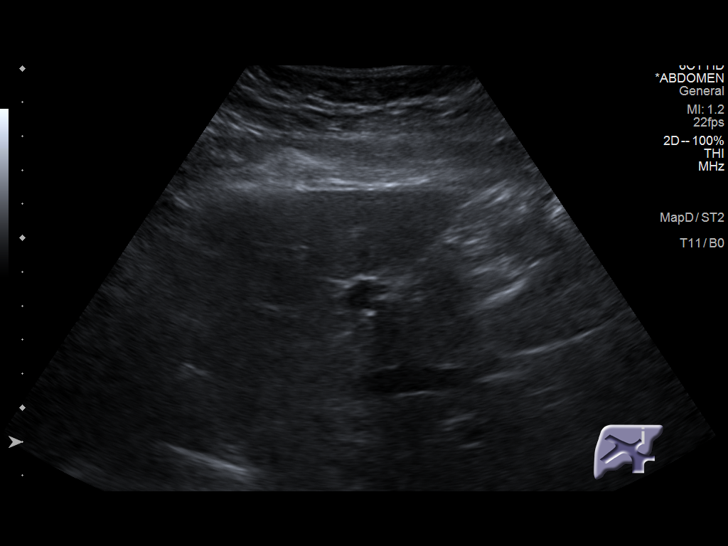
[im 42/111]
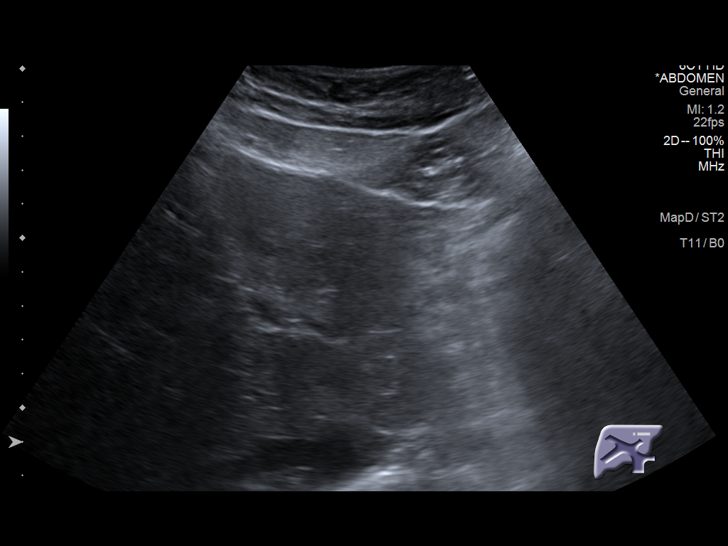
[im 51/111]
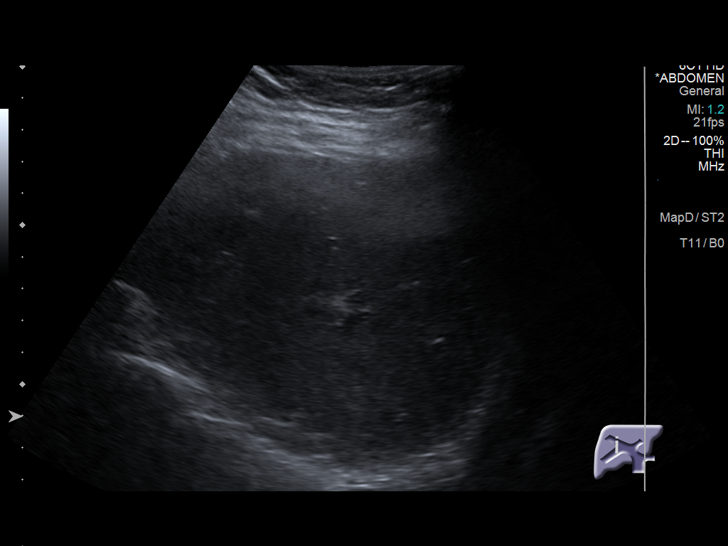
[im 60/111]
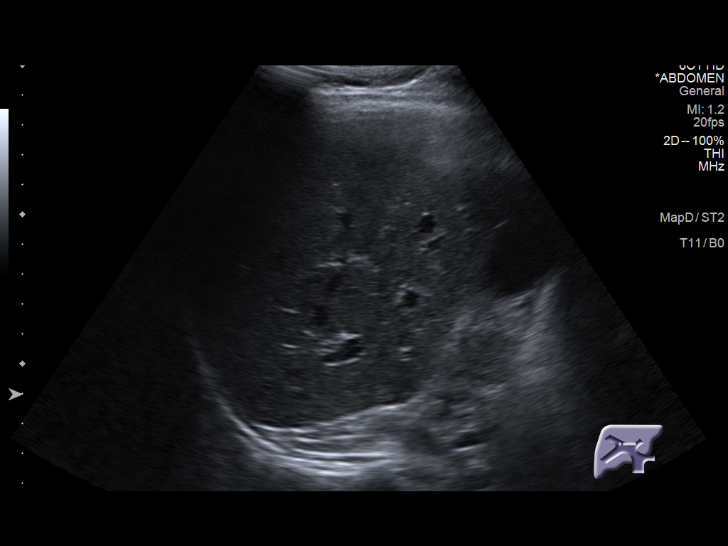
[im 69/111]
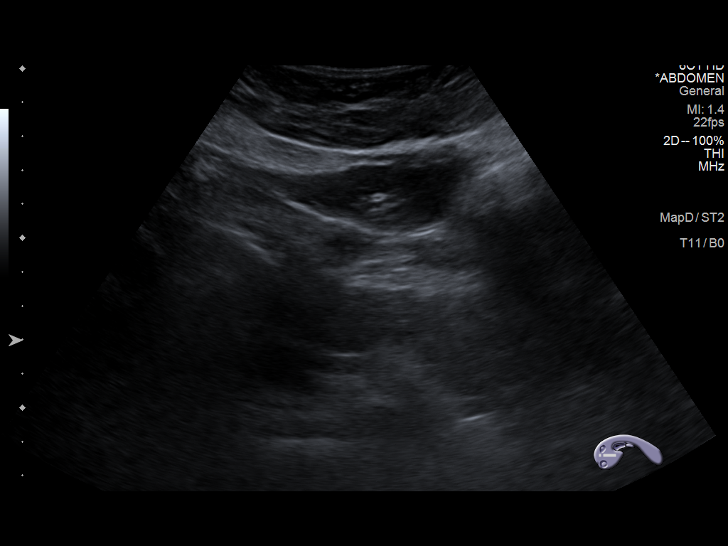
[im 74/111]
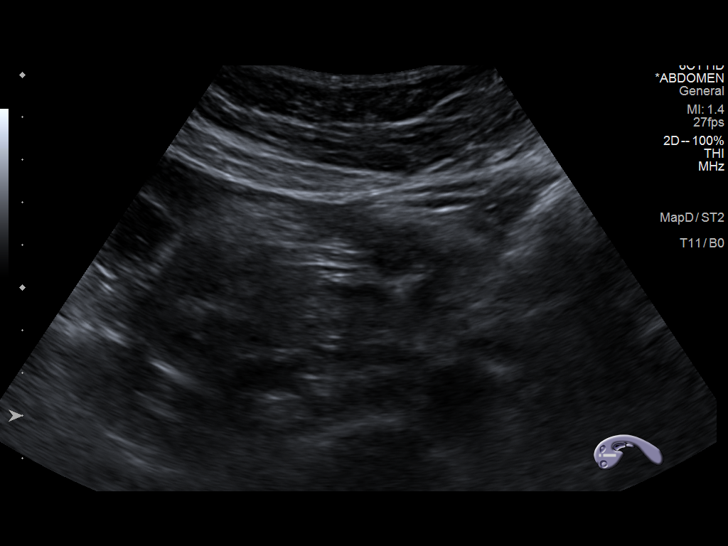
[im 83/111]
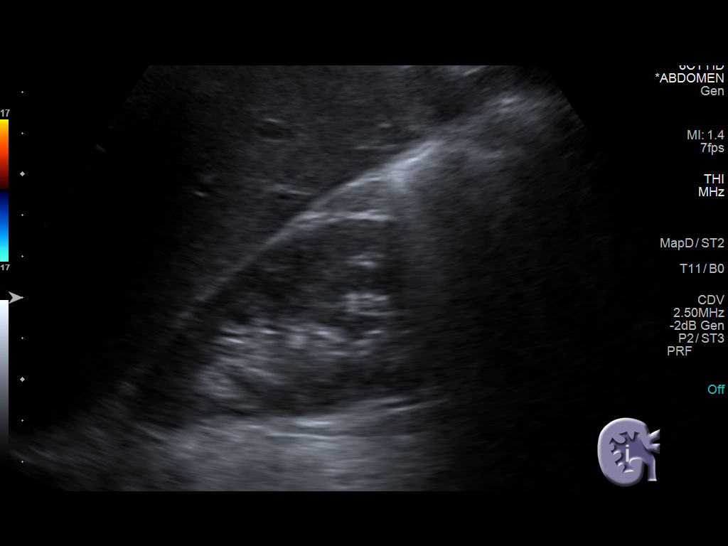
[im 92/111]
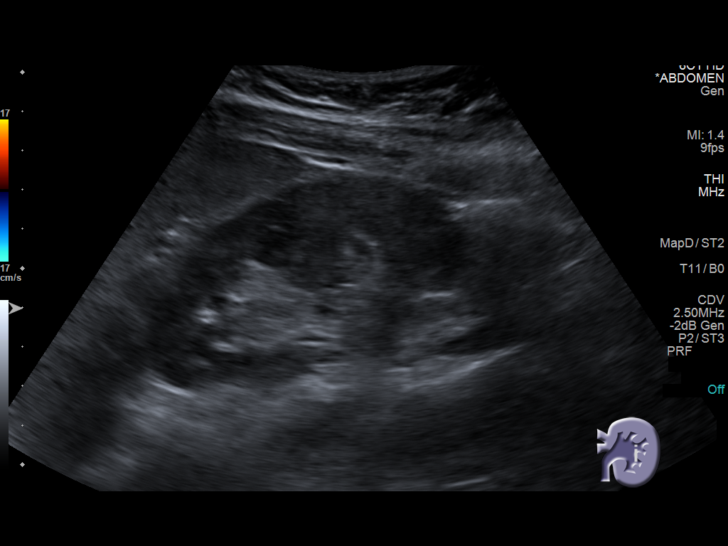
[im 101/111]
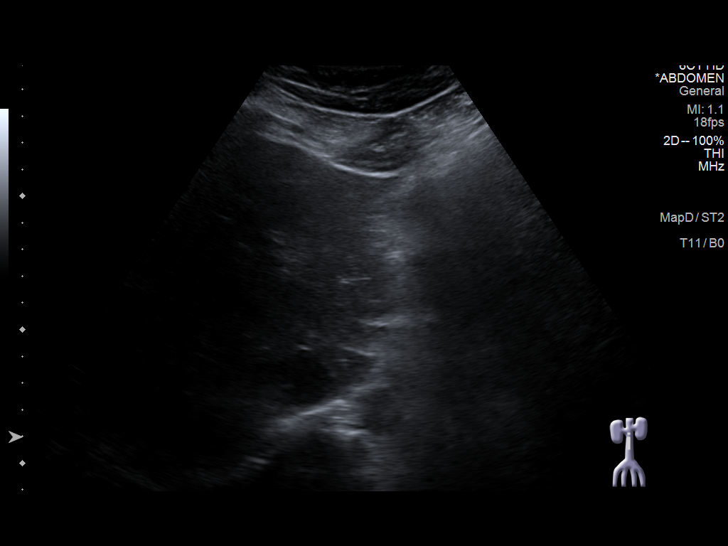
[im 111/111]
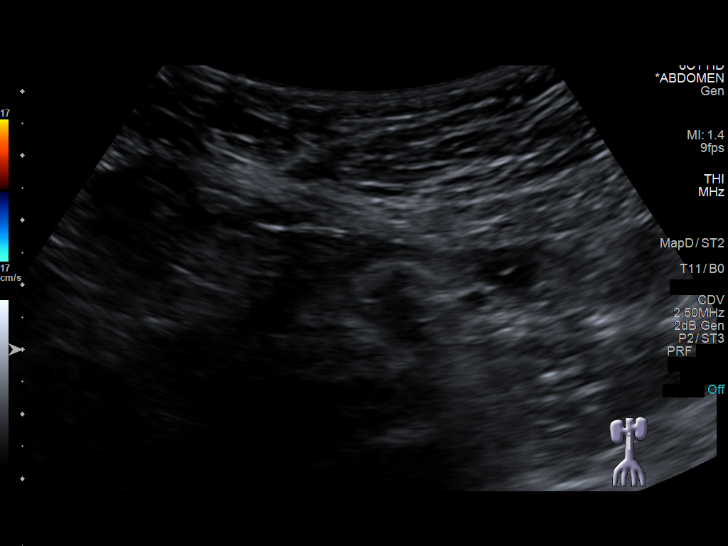

[14 of 25 positions shown; findings below may reference images not displayed]

FINDINGS: Gallbladder: No gallstones or wall thickening visualized. No
sonographic Murphy sign noted by sonographer.

Common bile duct: Diameter: 5.0 mm

Liver: No focal lesion identified. Within normal limits in
parenchymal echogenicity. Portal vein is patent on color Doppler
imaging with normal direction of blood flow towards the liver.

IVC: No abnormality visualized.

Pancreas: Visualized portion unremarkable.

Spleen: Size and appearance within normal limits.

Right Kidney: Length: 11.0 cm. Echogenicity within normal limits. No
mass or hydronephrosis visualized.

Left Kidney: Length: 10.7 cm. Echogenicity within normal limits. No
mass or hydronephrosis visualized.

Abdominal aorta: No aneurysm visualized.

Other findings: None.
IMPRESSION: 1. Normal exam.

## 2022-08-28 ENCOUNTER — Telehealth: Payer: Medicaid Other | Admitting: Physician Assistant

## 2022-08-28 DIAGNOSIS — B3731 Acute candidiasis of vulva and vagina: Secondary | ICD-10-CM | POA: Diagnosis not present

## 2022-08-29 MED ORDER — FLUCONAZOLE 150 MG PO TABS: 150.0000 mg | ORAL_TABLET | ORAL | 0 refills | Status: AC | PRN

## 2022-08-29 NOTE — Progress Notes (Signed)
E-Visit for Vaginal Symptoms  We are sorry that you are not feeling well. Here is how we plan to help! Based on what you shared with me it looks like you: May have a yeast vaginosis  Vaginosis is an inflammation of the vagina that can result in discharge, itching and pain. The cause is usually a change in the normal balance of vaginal bacteria or an infection. Vaginosis can also result from reduced estrogen levels after menopause.  The most common causes of vaginosis are:   Bacterial vaginosis which results from an overgrowth of one on several organisms that are normally present in your vagina.   Yeast infections which are caused by a naturally occurring fungus called candida.   Vaginal atrophy (atrophic vaginosis) which results from the thinning of the vagina from reduced estrogen levels after menopause.   Trichomoniasis which is caused by a parasite and is commonly transmitted by sexual intercourse.  Factors that increase your risk of developing vaginosis include: Medications, such as antibiotics and steroids Uncontrolled diabetes Use of hygiene products such as bubble bath, vaginal spray or vaginal deodorant Douching Wearing damp or tight-fitting clothing Using an intrauterine device (IUD) for birth control Hormonal changes, such as those associated with pregnancy, birth control pills or menopause Sexual activity Having a sexually transmitted infection  Your treatment plan is Diflucan (fluconazole) 150mg tablet once, may repeat in 72 hours if needed.  I have electronically sent this prescription into the pharmacy that you have chosen.  Be sure to take all of the medication as directed. Stop taking any medication if you develop a rash, tongue swelling or shortness of breath. Mothers who are breast feeding should consider pumping and discarding their breast milk while on these antibiotics. However, there is no consensus that infant exposure at these doses would be harmful.  Remember  that medication creams can weaken latex condoms. .   HOME CARE:  Good hygiene may prevent some types of vaginosis from recurring and may relieve some symptoms:  Avoid baths, hot tubs and whirlpool spas. Rinse soap from your outer genital area after a shower, and dry the area well to prevent irritation. Don't use scented or harsh soaps, such as those with deodorant or antibacterial action. Avoid irritants. These include scented tampons and pads. Wipe from front to back after using the toilet. Doing so avoids spreading fecal bacteria to your vagina.  Other things that may help prevent vaginosis include:  Don't douche. Your vagina doesn't require cleansing other than normal bathing. Repetitive douching disrupts the normal organisms that reside in the vagina and can actually increase your risk of vaginal infection. Douching won't clear up a vaginal infection. Use a latex condom. Both female and female latex condoms may help you avoid infections spread by sexual contact. Wear cotton underwear. Also wear pantyhose with a cotton crotch. If you feel comfortable without it, skip wearing underwear to bed. Yeast thrives in moist environments Your symptoms should improve in the next day or two.  GET HELP RIGHT AWAY IF:  You have pain in your lower abdomen ( pelvic area or over your ovaries) You develop nausea or vomiting You develop a fever Your discharge changes or worsens You have persistent pain with intercourse You develop shortness of breath, a rapid pulse, or you faint.  These symptoms could be signs of problems or infections that need to be evaluated by a medical provider now.  MAKE SURE YOU   Understand these instructions. Will watch your condition. Will get help right   away if you are not doing well or get worse.  Thank you for choosing an e-visit.  Your e-visit answers were reviewed by a board certified advanced clinical practitioner to complete your personal care plan. Depending  upon the condition, your plan could have included both over the counter or prescription medications.  Please review your pharmacy choice. Make sure the pharmacy is open so you can pick up prescription now. If there is a problem, you may contact your provider through MyChart messaging and have the prescription routed to another pharmacy.  Your safety is important to us. If you have drug allergies check your prescription carefully.   For the next 24 hours you can use MyChart to ask questions about today's visit, request a non-urgent call back, or ask for a work or school excuse. You will get an email in the next two days asking about your experience. I hope that your e-visit has been valuable and will speed your recovery.  I have spent 5 minutes in review of e-visit questionnaire, review and updating patient chart, medical decision making and response to patient.   Choua Ikner M Kajah Santizo, PA-C
